# Patient Record
Sex: Female | Born: 1970 | Race: White | Hispanic: Yes | Marital: Single
Health system: Midwestern US, Community
[De-identification: ages and names within clinical notes are randomized; demographics above are authoritative.]

## PROBLEM LIST (undated history)

## (undated) DIAGNOSIS — I10 Essential (primary) hypertension: Secondary | ICD-10-CM

## (undated) DIAGNOSIS — M1990 Unspecified osteoarthritis, unspecified site: Secondary | ICD-10-CM

## (undated) DIAGNOSIS — C801 Malignant (primary) neoplasm, unspecified: Secondary | ICD-10-CM

## (undated) HISTORY — PX: MASTECTOMY: SHX3

## (undated) HISTORY — PX: NASAL RECONSTRUCTION WITH SEPTAL REPAIR: SHX5665

## (undated) HISTORY — PX: ABDOMINAL HYSTERECTOMY: SHX81

## (undated) HISTORY — PX: CARDIAC ELECTROPHYSIOLOGY STUDY AND ABLATION: SHX1294

## (undated) HISTORY — PX: ANTERIOR CRUCIATE LIGAMENT REPAIR: SHX115

---

## 2004-12-18 ENCOUNTER — Emergency Department (HOSPITAL_COMMUNITY): Admission: EM | Admit: 2004-12-18 | Discharge: 2004-12-18 | Payer: Self-pay | Admitting: Emergency Medicine

## 2006-09-04 ENCOUNTER — Emergency Department (HOSPITAL_COMMUNITY): Admission: EM | Admit: 2006-09-04 | Discharge: 2006-09-04 | Payer: Self-pay | Admitting: Emergency Medicine

## 2008-11-10 LAB — CBC WITH AUTOMATED DIFF
ABS. EOSINOPHILS: 0.1 10*3/uL (ref 0.0–0.4)
ABS. LYMPHOCYTES: 1.2 10*3/uL (ref 0.8–3.5)
ABS. MONOCYTES: 0.4 10*3/uL (ref 0–1.0)
ABS. NEUTROPHILS: 3.3 10*3/uL (ref 1.8–8.0)
BASOPHILS: 0 % (ref 0–3)
EOSINOPHILS: 3 % (ref 0–5)
HCT: 40.7 % (ref 36.0–46.0)
HGB: 13.5 g/dL (ref 12.0–16.0)
LYMPHOCYTES: 23 % (ref 20–51)
MCH: 28.3 PG (ref 25.0–35.0)
MCHC: 33.2 g/dL (ref 31.0–37.0)
MCV: 85.3 FL (ref 78.0–102.0)
MONOCYTES: 7 % (ref 2–9)
MPV: 7.1 FL — ABNORMAL LOW (ref 7.4–10.4)
NEUTROPHILS: 67 % (ref 42–75)
PLATELET: 279 10*3/uL (ref 130–400)
RBC: 4.78 M/uL (ref 4.10–5.10)
RDW: 14.2 % (ref 11.5–14.5)
WBC: 5 10*3/uL (ref 4.5–13.0)

## 2008-11-10 LAB — MAGNESIUM: Magnesium: 2.2 MG/DL (ref 1.8–2.4)

## 2008-11-10 LAB — METABOLIC PANEL, BASIC
Anion gap: 5 mmol/L (ref 5–15)
BUN/Creatinine ratio: 20 (ref 12–20)
BUN: 14 MG/DL (ref 7–18)
CO2: 27 MMOL/L (ref 21–32)
Calcium: 9.2 MG/DL (ref 8.4–10.4)
Chloride: 107 MMOL/L (ref 100–108)
Creatinine: 0.7 MG/DL (ref 0.6–1.3)
GFR est AA: >60 mL/min/{1.73_m2} (ref >60)
GFR est non-AA: >60 mL/min/{1.73_m2} (ref >60)
Glucose: 71 MG/DL — ABNORMAL LOW (ref 74–99)
Potassium: 4.5 MMOL/L (ref 3.5–5.5)
Sodium: 139 MMOL/L (ref 136–145)

## 2010-01-18 LAB — D-DIMER, QUANTITATIVE: D-Dimer, Quant: 0.35 ug/ml(FEU) (ref <0.46)

## 2010-01-18 LAB — DRUG SCREEN UR - NO CONFIRM
ACETAMINOPHEN: NEGATIVE
AMPHETAMINES: NEGATIVE
BARBITURATES: NEGATIVE
BENZODIAZEPINES: NEGATIVE
COCAINE: NEGATIVE
METHADONE: NEGATIVE
Methamphetamines: NEGATIVE
OPIATES: NEGATIVE
PCP(PHENCYCLIDINE): NEGATIVE
THC (TH-CANNABINOL): NEGATIVE
TRICYCLICS: NEGATIVE

## 2010-01-18 LAB — URINALYSIS W/ RFLX MICROSCOPIC
Bilirubin: NEGATIVE
Blood: NEGATIVE
Glucose: NEGATIVE MG/DL
Ketone: NEGATIVE MG/DL
Leukocyte Esterase: NEGATIVE
Nitrites: NEGATIVE
Protein: NEGATIVE MG/DL
Specific gravity: <1.005 (ref 1.003–1.030)
Urobilinogen: 0.2 EU/DL (ref 0.2–1.0)
pH (UA): 5.5 (ref 5.0–8.0)

## 2010-01-18 LAB — CBC WITH AUTOMATED DIFF
ABS. BASOPHILS: 0 10*3/uL (ref 0.0–0.1)
ABS. EOSINOPHILS: 0.1 10*3/uL (ref 0.0–0.4)
ABS. LYMPHOCYTES: 1.6 10*3/uL (ref 0.9–3.6)
ABS. MONOCYTES: 0.5 10*3/uL (ref 0.05–1.2)
ABS. NEUTROPHILS: 4.5 10*3/uL (ref 1.8–8.0)
BASOPHILS: 0 % (ref 0–2)
EOSINOPHILS: 2 % (ref 0–5)
HCT: 38.6 % (ref 35.0–45.0)
HGB: 13.5 g/dL (ref 12.0–16.0)
LYMPHOCYTES: 24 % (ref 21–52)
MCH: 28.7 PG (ref 24.0–34.0)
MCHC: 35 g/dL (ref 31.0–37.0)
MCV: 82.1 FL (ref 74.0–97.0)
MONOCYTES: 7 % (ref 3–10)
MPV: 9.3 FL (ref 9.2–11.8)
NEUTROPHILS: 67 % (ref 40–73)
PLATELET: 280 10*3/uL (ref 135–420)
RBC: 4.7 M/uL (ref 4.20–5.30)
RDW: 13.2 % (ref 11.6–14.5)
WBC: 6.7 10*3/uL (ref 4.6–13.2)

## 2010-01-18 LAB — TROPONIN I
Troponin-I, QT: <0.04 NG/ML (ref 0.00–0.08)
Troponin-I, QT: <0.04 NG/ML (ref 0.00–0.08)

## 2010-01-18 LAB — METABOLIC PANEL, COMPREHENSIVE
A-G Ratio: 1.2 (ref 0.8–1.7)
ALT (SGPT): 27 U/L — ABNORMAL LOW (ref 30–65)
AST (SGOT): 13 U/L — ABNORMAL LOW (ref 15–37)
Albumin: 4.4 g/dL (ref 3.4–5.0)
Alk. phosphatase: 105 U/L (ref 50–136)
Anion gap: 8 mmol/L (ref 5–15)
BUN/Creatinine ratio: 14 (ref 12–20)
BUN: 11 MG/DL (ref 7–18)
Bilirubin, total: 0.4 MG/DL (ref 0.1–0.9)
CO2: 26 MMOL/L (ref 21–32)
Calcium: 9.4 MG/DL (ref 8.4–10.4)
Chloride: 102 MMOL/L (ref 100–108)
Creatinine: 0.8 MG/DL (ref 0.6–1.3)
GFR est AA: >60 mL/min/{1.73_m2} (ref >60)
GFR est non-AA: >60 mL/min/{1.73_m2} (ref >60)
Globulin: 3.8 g/dL (ref 2.0–4.0)
Glucose: 90 MG/DL (ref 74–99)
Potassium: 3.7 MMOL/L (ref 3.5–5.5)
Protein, total: 8.2 g/dL (ref 6.4–8.2)
Sodium: 136 MMOL/L (ref 136–145)

## 2010-01-18 LAB — PROTHROMBIN TIME + INR
INR: 1 (ref 0.0–1.2)
Prothrombin time: 12.9 SECS (ref 11.5–15.2)

## 2010-01-18 LAB — CKMB PROFILE
CK - MB: 0.6 ng/ml (ref 0.5–3.6)
CK - MB: 0.5 ng/ml (ref 0.5–3.6)
CK-MB Index: 0.7 % (ref 0.0–4.0)
CK-MB Index: 0.6 % (ref 0.0–4.0)
CK: 83 U/L (ref 26–192)
CK: 80 U/L (ref 26–192)

## 2010-01-18 LAB — PTT: aPTT: 30.9 s (ref 24.6–37.7)

## 2010-01-18 LAB — D DIMER: D DIMER: 0.35 ug/ml(FEU) (ref <0.46)

## 2010-01-25 NOTE — Progress Notes (Signed)
Chief Complaint   Patient presents with   ??? Hospital Follow Up     heart palpitations, needs appt with Dr. Doristine Johns

## 2010-01-25 NOTE — Progress Notes (Signed)
HISTORY OF PRESENT ILLNESS  Carol Young is a 39 y.o. female.  HPI  1.  Rapid heart beat 2- 3 times a year.   So fast and pounding that makes her pass out.    One episode in 2009 and fell down the stairs and broke her leg.  Had 3 more episode of fall due to arrhythmias.     2.  Also increased urination and the back pain.( flank pain) only after each episode of palpitation and lasted about 2 hours and had dry mouth.    3.  Patient mentioned that she had cysts on the ovaries and was diagnosed with possible PCOS.    4. Acne on the chest    Patient Active Problem List   Diagnoses Date Noted   ??? Acne [706.1AQ] 01/25/2010   ??? Seborrheic eczema [690.18Q] 01/25/2010   ??? Cyst of ovary [620.2AZ] 01/25/2010   ??? Palpitation [785.1A] 01/25/2010       Review of Systems   Constitutional: Positive for malaise/fatigue. Negative for fever, chills, weight loss and diaphoresis.        Working long hours   HENT: Negative for hearing loss, ear pain, nosebleeds, congestion, neck pain, tinnitus and ear discharge.    Eyes: Negative for blurred vision, double vision, photophobia and pain.   Respiratory: Negative for cough, hemoptysis and shortness of breath.    Cardiovascular: Positive for chest pain, palpitations and leg swelling. Negative for orthopnea, claudication and PND.        Pounding pain   Gastrointestinal: Negative for heartburn, nausea, vomiting, abdominal pain, diarrhea and constipation.   Genitourinary: Positive for frequency and flank pain.        Only after the episode of palpitation and lasted a couple of days and then gone.  More like spasm.   Musculoskeletal: Negative for myalgias, back pain and joint pain.   Skin: Positive for rash and itching.        Was told adult onset acne    Scalp seborrhatic   Neurological: Positive for dizziness, tingling and headaches. Negative for weakness.        During the palpitation episode, just before it in the hands and feet.    Endo/Heme/Allergies: Positive for environmental allergies. Negative for polydipsia.   Psychiatric/Behavioral: Negative for depression. The patient has insomnia. The patient is not nervous/anxious.      Physical Exam   Constitutional: She is oriented to person, place, and time. She appears well-developed and well-nourished.   HENT:   Head: Normocephalic.   Eyes: Pupils are equal, round, and reactive to light. Right eye exhibits no discharge.   Cardiovascular: Normal rate, regular rhythm and normal heart sounds.  Exam reveals no gallop and no friction rub.    No murmur heard.  Pulmonary/Chest: Effort normal and breath sounds normal. No respiratory distress. She has no wheezes. She has no rales.   Abdominal: Soft. Bowel sounds are normal. She exhibits no distension. No tenderness. She has no rebound.        Flank test negative   Neurological: She is alert and oriented to person, place, and time.   Skin: Skin is warm and dry.   Psychiatric: She has a normal mood and affect. Her behavior is normal. Judgment and thought content normal.     BP 135/80   Pulse 75   Temp 98.3 ??F (36.8 ??C)   Resp 16   Ht 6' (1.829 m)   Wt 218 lb (98.884 kg)   BMI 29.57 kg/m2  LMP 01/25/2010    ASSESSMENT and PLAN  1. Heart palpitations (785.1K)  REFERRAL TO CARDIOLOGY   2. Acne (706.1AQ)   currently not on any medication   3. Seborrheic eczema (690.18Q)      """"""""""   4. Cyst of ovary (620.2AZ)          Patient will follow with Dr. Sigmund Hazel in regards to her heart arrythmia.  For the rest of issues.  She will book for a physical in the next few weeks and will get blood work done.  I have discussed the diagnosis with the patient and the fact that the patient does not require medication as this time.  However if the symptoms not improve with conservative management as given in the order above, he/she needs to return to clinic.  Patient in agreement with the above plan.

## 2010-01-25 NOTE — Patient Instructions (Signed)
English   Spanish  Palpitations: After Your Visit  Your Care Instructions  Heart palpitations are the uncomfortable sensation that your heart is beating fast or irregularly. You might feel pounding or fluttering in your chest. It might feel like your heart is skipping a beat.  Although palpitations may be caused by a heart problem, they also occur because of stress, fatigue, or use of alcohol, caffeine, or nicotine. Many medicines, including diet pills, antihistamines, decongestants, and some herbal products, can cause heart palpitations. Nearly everyone has palpitations from time to time.  Depending on your symptoms, your doctor may need to do more tests to try to find the cause of your palpitations.  Follow-up care is a key part of your treatment and safety. Be sure to make and go to all appointments, and call your doctor if you are having problems. It???s also a good idea to know your test results and keep a list of the medicines you take.  How can you care for yourself at home?  ?? Avoid caffeine, nicotine, and excess alcohol.   ?? Do not take illegal drugs, such as methamphetamines and cocaine.   ?? Do not take weight loss or diet medicines unless you talk with your doctor first.   ?? Get plenty of sleep.   ?? Do not overeat.   ?? If you have palpitations again, take deep breaths and try to relax. This may slow a racing heart.   ?? If you start to feel lightheaded, lie down to avoid injuries that might result if you pass out and fall down.   ?? Keep a record of your palpitations and bring it to your next doctor's appointment. Write down:   ?? The date and time.   ?? Your pulse. (If your heart is beating fast, it may be hard to count your pulse.)   ?? What you were doing when the palpitations started.   ?? How long the palpitations lasted.   ?? Any other symptoms.  ?? If an activity causes palpitations, slow down or stop. Talk to your doctor before you do that activity again.    ?? Take your medicines exactly as prescribed. Call your doctor if you think you are having a problem with your medicine.  When should you call for help?  Call 911 anytime you think you may need emergency care. For example, call if:  ?? You passed out (lost consciousness).   ?? You have chest pain or pressure. This may occur with:   ?? Sweating.   ?? Shortness of breath.   ?? Nausea or vomiting.   ?? Pain that spreads from the chest to the neck, jaw, or one or both shoulders or arms.   ?? Dizziness or lightheadedness.   ?? A fast or irregular pulse.  After calling 911, chew 1 adult-strength aspirin. Wait for an ambulance. Do not try to drive yourself.   ?? You have signs of a stroke. These may include:   ?? Sudden numbness, paralysis, or weakness in your face, arm, or leg, especially on only one side of your body.   ?? New problems with walking or balance.   ?? Sudden vision changes.   ?? Drooling or slurred speech.   ?? New problems speaking or understanding simple statements, or feeling confused.   ?? A sudden, severe headache that is different from past headaches.  Call your doctor now or seek immediate medical care if:  ?? You have heart palpitations and:   ?? Are  dizzy or lightheaded, or you feel like you may faint.   ?? Have new or increased shortness of breath.  Watch closely for changes in your health, and be sure to contact your doctor if:  ?? You continue to have heart palpitations.     Where can you learn more?   Go to [email or web address]  Enter R508 in the search box to learn more about "Palpitations: After Your Visit."    ?? 1995-2010 [employer]. Care instructions adapted under license by [employer] (which disclaims liability or warranty for this information). This care instruction is for use with your licensed healthcare professional. If you have questions about a medical condition or this instruction, always ask your healthcare professional. Healthwise disclaims any warranty or liability for your use of this information.  Content Version: 8.8.72453; Last Revised: May 8, 2009English   Spanish  Polycystic Ovary Syndrome: After Your Visit  Your Care Instructions  Polycystic ovary syndrome (PCOS) occurs when a hormone change causes problems with ovulation. Ovulation is when a woman's ovary releases an egg. If you have PCOS, you may not have normal, regular menstrual cycles. Doctors do not fully understand what causes PCOS, but it seems to be linked to genetics, obesity, and a risk for diabetes. If you have PCOS, your sisters and daughters have a higher chance of developing PCOS.  You may have other symptoms, such as weight gain, acne, too much hair growth on the face or body, high blood pressure, and high blood sugar levels. Your ovaries may have cysts, or growths that are not cancer, on them.  Keep in mind that although you may not have regular periods, you can still get pregnant. Talk to your doctor about birth control if you do not want to be pregnant. Sometimes the hormone changes with PCOS can also make it hard for some women to get pregnant (infertility). If this is a concern, talk to your doctor about treatment for infertility.  Women who have PCOS can go for months or longer with no period. Your doctor may recommend medicines that can help restore regular cycles. This may prevent heavy periods and precancerous changes in your uterus.   Follow-up care is a key part of your treatment and safety. Be sure to make and go to all appointments, and call your doctor if you are having problems. It???s also a good idea to know your test results and keep a list of the medicines you take.  How can you care for yourself at home?  ?? Take your medicines exactly as prescribed. Call your doctor if you think you are having a problem with your medicine.   ?? Eat a healthy diet. Include fruits, vegetables, beans, and whole grains in your diet every day.   ?? If you are overweight, losing weight can relieve many of the symptoms of PCOS. Talk to your doctor about safe methods for losing weight.   ?? Get at least 30 minutes of exercise on most days of the week. Walking is a good choice. You also may want to do other activities, such as running, swimming, cycling, or playing tennis or team sports.   ?? Unwanted hair growth can be treated with bleaching, plucking, electrolysis, or laser therapy.   ?? Acne can be treated with over-the-counter medicines that have benzoyl peroxide or salicylic acid in them.  When should you call for help?  Call your doctor now or seek immediate medical care if:  ?? You have severe vaginal bleeding. You are passing  clots of blood and soaking through your usual pads or tampons every hour for 2 or more hours.  Watch closely for changes in your health, and be sure to contact your doctor if:  ?? You have more vaginal bleeding, or bleeding is more irregular.     Where can you learn more?   Go to [email or web address]  Enter K559 in the search box to learn more about "Polycystic Ovary Syndrome: After Your Visit."    ?? 1995-2010 [employer]. Care instructions adapted under license by [employer] (which disclaims liability or warranty for this information). This care instruction is for use with your licensed healthcare professional. If you have questions about a medical condition or this instruction, always ask your healthcare professional. Healthwise disclaims any warranty or liability for your use of this information.  Content Version: 8.8.72453; Last Revised: October 15, 2008

## 2010-01-28 NOTE — Progress Notes (Signed)
History of Present Illness:   A 39 year old Caucasian female referred by Dr. Dalene Carrow for recurrent syncope and palpitations.  Her symptoms started back in her 47s when she noted she would overexert herself during exercise.  She would sometimes feel a racing heart and she would actually cough and the palpitations would terminate.  Over the past few years, she has had multiple episodes of syncope associated with palpitations and a racing heart. Interestingly, when she feels her heart race, she has the urge to urinate.  She actually fell in 2009 during one episode and actually broke her leg.  She had an echocardiogram by report by Dr. Alfredo Martinez in [facility] that was normal.  She also had a stress test that she says was within normal limits but she feels like she did not exercise as hard as she could have.  She denies any chest pain, dyspnea, PND, orthopnea, edema.  She has not tried any medications.  She states that she had an event monitor that she wore for a number of weeks in the past but this was non-revealing.      Impression:   1. Recurrent syncope.  2. Recurrent palpitations with a racing heart along with urinary urgency concerning for SVT.  3. History of trauma with broken leg related to her syncope in the past.     Overall she has sense of doom when she has these episodes.  They have greatly affected her lifestyle and she is even afraid to buy a house with two stories in case she falls or has a syncopal event she will harm herself.  I would like to obtain the echocardiogram from Dr. Sandrea Hammond office.  However, I feel given her constellation of symptoms specifically the severity of the syncope that we should proceed with electrophysiology study with possible ablation.  I noted that if she had typical AVNRT or accessory pathway we would go ahead and ablate it at that time.  If she, however, had more complex arrhythmias such as atrial fibrillation or atrial flutter or ventricular arrhythmia, we would stop and talk further about treatment options.  Nonetheless, she would like to think about it and she will get back to [place].  I feel starting her on empiric at this time may not be in her best interests since we do not have a definitive diagnosis.  She also may benefit from an implantable loop recorder if she does not want to proceed with ablation at this time or if her electrophysiology is not diagnostic.      Past Medical History   Diagnosis Date   ??? Contact dermatitis and other eczema, due to unspecified cause        No current outpatient prescriptions on file.       Social History   reports that she has quit smoking. She has never used smokeless tobacco.   reports that she drinks alcohol.    Family History  family history includes Arrhythmia in her father; Cancer in her brother, maternal aunt, and maternal grandmother; and Hypertension in her father.    Review of Systems  Except as stated above include:  Constitutional: Negative for fever, chills and malaise/fatigue.   HEENT: Negative.   Gastrointestinal: Negative.  Genitourinary: Negative.   Musculoskeletal: Negative.  Neurological: Negative.   Endocrine:  Negative  Psychiatric:  Negative    PHYSICAL EXAM  BP Readings from Last 3 Encounters:   01/28/10 120/82   01/25/10 135/80        Pulse Readings  from Last 3 Encounters:   01/28/10 70   01/25/10 75       Wt Readings from Last 3 Encounters:   01/28/10 218 lb (98.884 kg)   01/25/10 218 lb (98.884 kg)       General:  Alert and oriented to person, place, and time.  No acute distress.  Head and Neck:  No jugular venous distention or carotid bruits.  Lungs:  Clear bilaterally.  Heart:  Regular rate and rhythm.  Normal S1/S2.  No significant murmurs, rubs or gallops.  Abdomen:  Soft and nontender.  Extremities:  No significant edema.  Neurological:  Grossly normal.

## 2010-02-01 MED ORDER — CLINDAMYCIN-BENZOYL PEROXIDE 1 %-5 % TOPICAL GEL
1-5 % | Freq: Two times a day (BID) | CUTANEOUS | Status: AC
Start: 2010-02-01 — End: ?

## 2010-02-01 NOTE — Progress Notes (Signed)
Subjective:   39 y.o. female for annual routine Pap and checkup.  Patient's last menstrual period was 01/25/2010.    Social History: single partner, contraception - condoms.  Pertinent past medical hstory: no history of HTN, DVT, CAD, DM, liver disease, migraines or smoking.    Patient Active Problem List   Diagnoses Code   ??? Acne 706.1AQ   ??? Seborrheic eczema 690.18Q   ??? Cyst of ovary 620.2AZ   ??? Palpitation 785.1A   ??? Syncope and collapse 780.2          ROS:  Feeling well. No dyspnea or chest pain on exertion.  No abdominal pain, change in bowel habits, black or bloody stools.  No urinary tract symptoms. GYN ROS: Heavy bleeding some times two times a month, pelvic pain or discharge, has history of cervicitis, no breast pain or new or enlarging lumps on self exam, had two breast lumpectomy . They were benign.  Also acne on the face and chest. No neurological complaints.    Objective:   BP 130/76   Pulse 78   Temp 98.4 ??F (36.9 ??C)   Resp 16   Ht 6' (1.829 m)   Wt 216 lb (97.977 kg)   BMI 29.29 kg/m2   LMP 01/25/2010  The patient appears well, alert, oriented x 3, in no distress.  ENT normal.  Neck supple. No adenopathy or thyromegaly. PERLA. Lungs are clear, good air entry, no wheezes, rhonchi or rales. S1 and S2 normal, no murmurs, regular rate and rhythm. Abdomen soft without tenderness, guarding, mass or organomegaly. Extremities show no edema, normal peripheral pulses. Neurological is normal, no focal findings.    BREAST EXAM: breasts appear normal, no suspicious masses, no skin or nipple changes or axillary nodes    PELVIC EXAM: normal external genitalia, vulva, vagina, cervix, uterus and adnexa,  Has folliculitis.    Assessment/Plan:   well woman  pap smear  return annually or prn  1. Acne vulgaris (706.1E)  clindamycin-benzoyl peroxide (DUAC) 1-5 % topical gel    2. Well woman exam (V70.0L)  CBC WITH AUTOMATED DIFF, METABOLIC PANEL, COMPREHENSIVE, LIPID PANEL, TSH, 3RD GENERATION, VITAMIN D, 25 HYDROXY, URINALYSIS W/O MICRO   3. Screening for malignant neoplasm of the cervix (V76.2)  PAP, LIQUID BASED, IMAGED W/HPV ZOXWR([id number])     Patient was seen by Dr. 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  She had some questions and we answered to the best of our ability.    We discussed the above diagnosis and order. Patient will be seen after the blood work results received.  I have advised the patient if she has any concern to come and see me.      Patient understands and agrees with the above plan.

## 2010-02-01 NOTE — Progress Notes (Deleted)
Chief Complaint   Patient presents with   ??? Follow-up       HISTORY OF PRESENT ILLNESS  Carol Young is a 39 y.o. female.  HPI    ROS    Physical Exam    ASSESSMENT and PLAN  {ASSESSMENT/PLAN:19072}

## 2010-02-02 LAB — CBC WITH AUTOMATED DIFF
ABS. BASOPHILS: 0.1 10*3/uL (ref 0.0–0.2)
ABS. EOSINOPHILS: 0.2 10*3/uL (ref 0.0–0.4)
ABS. IMM. GRANS.: 0 10*3/uL (ref 0.0–0.1)
ABS. LYMPHOCYTES: 1.2 10*3/uL (ref 0.7–4.5)
ABS. MONOCYTES: 0.5 10*3/uL (ref 0.1–1.0)
ABS. NEUTROPHILS: 3.3 10*3/uL (ref 1.8–7.8)
BASOPHILS: 1 % (ref 0–3)
EOSINOPHILS: 3 % (ref 0–7)
HCT: 42.9 % (ref 34.0–44.0)
HGB: 13.6 g/dL (ref 11.5–15.0)
IMMATURE GRANULOCYTES: 0 % (ref 0–2)
LYMPHOCYTES: 23 % (ref 14–46)
MCH: 27.9 pg (ref 27.0–34.0)
MCHC: 31.7 g/dL — ABNORMAL LOW (ref 32.0–36.0)
MCV: 88 fL (ref 80–98)
MONOCYTES: 9 % (ref 4–13)
NEUTROPHILS: 64 % (ref 40–74)
PLATELET: 303 10*3/uL (ref 140–415)
RBC: 4.88 x10E6/uL (ref 3.80–5.10)
RDW: 14 % (ref 11.7–15.0)
WBC: 5.2 10*3/uL (ref 4.0–10.5)

## 2010-02-02 LAB — METABOLIC PANEL, COMPREHENSIVE
A-G Ratio: 2 (ref 1.1–2.5)
ALT (SGPT): 12 IU/L (ref 0–40)
AST (SGOT): 10 IU/L (ref 0–40)
Albumin: 4.7 g/dL (ref 3.5–5.5)
Alk. phosphatase: 97 IU/L (ref 25–150)
BUN/Creatinine ratio: 9 (ref 8–20)
BUN: 8 mg/dL (ref 6–20)
Bilirubin, total: 0.4 mg/dL (ref 0.0–1.2)
CO2: 26 mmol/L (ref 20–32)
Calcium: 9.3 mg/dL (ref 8.7–10.2)
Chloride: 99 mmol/L (ref 97–108)
Creatinine: 0.85 mg/dL (ref 0.57–1.00)
GFR est AA: 101 mL/min/{1.73_m2} (ref >59)
GFR est non-AA: 87 mL/min/{1.73_m2} (ref >59)
GLOBULIN, TOTAL: 2.4 g/dL (ref 1.5–4.5)
Glucose: 85 mg/dL (ref 65–99)
Potassium: 3.8 mmol/L (ref 3.5–5.2)
Protein, total: 7.1 g/dL (ref 6.0–8.5)
Sodium: 136 mmol/L (ref 135–145)

## 2010-02-02 LAB — LIPID PANEL
Cholesterol, total: 179 mg/dL (ref 100–199)
HDL Cholesterol: 54 mg/dL (ref >39)
LDL, calculated: 106 mg/dL — ABNORMAL HIGH (ref 0–99)
Triglyceride: 95 mg/dL (ref 0–149)
VLDL, calculated: 19 mg/dL (ref 5–40)

## 2010-02-02 LAB — TSH 3RD GENERATION: TSH: 2.19 u[IU]/mL (ref 0.450–4.500)

## 2010-02-02 LAB — VITAMIN D, 25 HYDROXY: Vitamin D 25-Hydroxy: 24 ng/mL — ABNORMAL LOW (ref 32.0–100.0)

## 2010-02-02 NOTE — Progress Notes (Addendum)
Quick Note:    Please advise patient Lab work more normal only vitam D def. Could see her and explain and start her on medication. Thank you  ______

## 2010-02-03 LAB — WRITTEN AUTHORIZATION

## 2010-02-03 LAB — URINALYSIS (NO MICRO)
Bilirubin: NEGATIVE
Blood: NEGATIVE
Glucose: NEGATIVE
Ketone: NEGATIVE
Leukocyte Esterase: NEGATIVE
Nitrites: NEGATIVE
Protein: NEGATIVE
Specific Gravity: 1.026 (ref 1.005–1.030)
Urobilinogen: 0.2 mg/dL (ref 0.0–1.9)
pH (UA): 6.5 (ref 5.0–7.5)

## 2010-02-03 NOTE — Telephone Encounter (Signed)
Message copied by Tobin Chad on Wed Feb 03, 2010 12:40 PM  ------       Message from: Velia Meyer       Created: Tue Feb 02, 2010  7:22 PM         Please advise patient  Lab work more normal only vitam D def.  Could see her and explain and start her on medication.  Thank you

## 2010-02-03 NOTE — Telephone Encounter (Signed)
Patient aware of lab results

## 2010-02-05 NOTE — Progress Notes (Signed)
Quick Note:    Pap normal please advise.  ______

## 2010-02-09 NOTE — Progress Notes (Signed)
Here for f/u on Vitamin d.

## 2010-02-09 NOTE — Patient Instructions (Signed)
English   Spanish  Learning About Vitamin D  Why is it important to get enough vitamin D?  Your body needs vitamin D to absorb calcium. Calcium keeps your bones and muscles, including your heart, healthy and strong. Your body uses vitamin D to help your muscles absorb calcium and work well. If your muscles don't get enough calcium, then they can cramp, hurt, or feel weak. You may have long-term (chronic) muscle aches and pains.  People who do not get enough vitamin D throughout life have an increased chance of having thin and brittle bones (osteoporosis) in their later years. Children who don't get enough vitamin D may not grow as much as others their age. They also have a chance of getting a rare disease called rickets, which causes weak bones.  Research suggests that low levels of vitamin D also may be linked to a number of other problems such as high blood pressure, cancer, and heart disease.  Your body uses sunshine to make vitamin D. Another way to get vitamin D is from the foods you eat. Vitamin D also is available in supplements.  What is the recommended daily amount of vitamin D?  The amount of vitamin D you need changes as you get older. Most doctors suggest the following:  ?? Children and teens need 400 IU (international units) every day.   ?? People ages 19 to 49 need 400 to 800 IU every day.   ?? People ages 50 and older need 800 to 1,000 IU every day.  What is vitamin D deficiency?  Vitamin D deficiency means that the level of vitamin D in your body is lower than it should be. Most people don't get enough vitamin D.  In the winter, people often spend more time indoors and don't get enough sun. This can reduce how much vitamin D your body makes.  How can you get more vitamin D?  You can get vitamin D by:  ?? Taking a vitamin D pill, vitamin D drops, or a multivitamin pill. Many calcium pills also contain vitamin D.    ?? Eating foods that have vitamin D such as egg yolks, liver, oily fish, and foods and drinks with added (fortified) vitamin D. Most people don't get enough vitamin D through diet alone.   ?? Being out in sunlight a few days a week for 10 to 15 minutes without sunscreen. But experts disagree about whether people should spend even 10 to 15 minutes a day in the sun without sunscreen, because sunscreen helps prevent skin cancer. So you may want to get vitamin D from eating a healthy diet that includes foods fortified with vitamin D and by taking vitamin D pills.  Are there any risks from taking vitamin D?  ?? Getting too much vitamin D can cause problems, just like getting too little can. Talk with your doctor about how much and what sources of vitamin D are right for you and your child.   ?? Too much of any vitamin can make a child sick. Follow your doctor's instructions about using vitamin drops so that you don't give your child too much.   ?? Vitamin D may interact with other medicines. Tell your doctor about all of the medicines you take, including over-the-counter drugs, herbs, and pills. Tell your doctor about all of your current medical problems.     Where can you learn more?   Go to http://www.healthwise.net/[facility]  Enter V530 in the search box to learn more about "Learning   About Vitamin D."   ?? 1995-2010 Healthwise, Incorporated. Care instructions adapted under license by [employer] (which disclaims liability or warranty for this information). This care instruction is for use with your licensed healthcare professional. If you have questions about a medical condition or this instruction, always ask your healthcare professional. Healthwise disclaims any warranty or liability for your use of this information.  Content Version: 8.8.72453; Last Revised: October 13, 2010English   Spanish  Learning About Calcium  What is calcium?   Calcium is a mineral the body needs to make bones and teeth, transmit nerve messages, tighten (contract) muscles, and help the blood to clot and the heart to work properly.  Everyone needs calcium. How much you need changes as you age. After age 30, men and women naturally begin to lose bone mass. You can slow bone loss and possibly prevent osteoporosis by eating a diet rich in calcium and vitamin D. Getting enough calcium and vitamin D is especially important for women in the first few years after menopause.  Calcium is found in milk and milk products (including yogurt and cheese), in some leafy green vegetables (broccoli, spinach, kale), in beans, and in some nuts.  Most people do not get enough calcium through diet alone. You need to eat 3 to 4 servings a day of foods high in calcium to get the recommended daily amount.  If you do not get enough calcium from the foods you eat, change your diet or take calcium and vitamin D supplements. Your body needs vitamin D to absorb calcium.  How much calcium do you need?  How much calcium you need each day changes as you age.  ?? Age 1 to 3 years: 500 milligrams   ?? Age 4 to 8 years: 800 milligrams   ?? Age 9 to 18 years: 1,300 milligrams   ?? Age 19 to 50 years: 1,000 milligrams   ?? Older than 50 years: 1,200 milligrams  Pregnant or nursing women need the same amount of calcium as other women their age: 1,000 to 1,300 milligrams a day.  How can you get enough calcium?  The best source of calcium is milk fortified with vitamin D. Four glasses of milk a day provide about 1,200 milligrams of calcium.  Common foods with calcium:  ?? Yogurt (plain or low-fat). An 8-ounce serving provides 415 milligrams of calcium.   ?? Cheddar cheese. A 1 1/2-ounce serving provides 306 milligrams.   ?? Milk (skim, 2%, or whole). A 1-cup serving provides about 300 milligrams.   ?? Cottage cheese (1% milk fat). A 1-cup serving provides 138 milligrams.   If you cannot eat or drink dairy foods, you can try:  ?? Foods that are calcium-fortified, such as:   ?? Orange juice. A 1-cup serving of calcium-fortified orange juice provides 500 milligrams of calcium.   ?? Soy milk. A 1-cup serving of enriched soy milk provides 300 milligrams of calcium.  ?? Almonds. A 1/4-cup serving provides 95 milligrams of calcium.   ?? Canned salmon. A 3-ounce serving provides 180 milligrams of calcium.   ?? Tofu (firm, made with calcium sulfate). A 1/2-cup serving provides 204 milligrams.  You may also need to take a supplement to make sure you are getting the calcium you need. It is important to take calcium supplements with vitamin D, which helps your body absorb the calcium. Examples of supplements include calcium carbonate and calcium citrate.  Everyone who has been diagnosed with osteoporosis should take calcium and vitamin D   supplements in addition to eating a diet rich in these nutrients.     Where can you learn more?   Go to http://www.healthwise.net/[facility]  Enter S264 in the search box to learn more about "Learning About Calcium."   ?? 1995-2010 Healthwise, Incorporated. Care instructions adapted under license by [employer] (which disclaims liability or warranty for this information). This care instruction is for use with your licensed healthcare professional. If you have questions about a medical condition or this instruction, always ask your healthcare professional. Healthwise disclaims any warranty or liability for your use of this information.  Content Version: 8.8.72453; Last Revised: November 07, 2008

## 2010-02-09 NOTE — Progress Notes (Signed)
S/  Carol Young is a 39 y.o. female who is here to review the result of  Blood work.     Patient  Is still  having symptoms of palpitation and one spell that she almost passed out.    Past medical history:I have reviewed and confirmed the past medical history in the chart.    Allergies: reviewed allergy section in the chart    Patient Active Problem List   Diagnoses Code   ??? Acne 706.1AQ   ??? Seborrheic eczema 690.18Q   ??? Cyst of ovary 620.2AZ   ??? Palpitation 785.1A   ??? Syncope and collapse 780.2       Current outpatient prescriptions   Medication Sig Dispense Refill   ??? clindamycin-benzoyl peroxide (DUAC) 1-5 % topical gel Apply  to affected area two (2) times a day. Apply to affected area after the skin has been cleansed and dried.  1 Tube  0         O/ NAD  BP 131/77   Pulse 70   Temp(Src) 98.3 ??F (36.8 ??C) (Oral)   Resp 16   Ht 6' (1.829 m)   Wt 220 lb (99.791 kg)   BMI 29.84 kg/m2   LMP 01/25/2010    Examination not done except vitals      Component Value Date/Time   WBC 5.2 02/01/10 10:00 AM   HGB 13.6 02/01/10 10:00 AM   HCT 42.9 02/01/10 10:00 AM   PLATELET 303 02/01/10 10:00 AM   MCV 88 02/01/10 10:00 AM         Component Value Date/Time   Cholesterol, total 179 02/01/10 10:00 AM   HDL Cholesterol 54 02/01/10 10:00 AM   LDL, calculated 106 02/01/10 10:00 AM   Triglyceride 95 02/01/10 10:00 AM         Component Value Date/Time   GFR est AA 101 02/01/10 10:00 AM   GFR est non-AA 87 02/01/10 10:00 AM   Creatinine 0.85 02/01/10 10:00 AM   BUN 8 02/01/10 10:00 AM   Sodium 136 02/01/10 10:00 AM   Potassium 3.8 02/01/10 10:00 AM   Chloride 99 02/01/10 10:00 AM   CO2 26 02/01/10 10:00 AM          Component Value Date/Time   TSH, 3rd generation 2.190 02/01/10 10:00 AM        Lab Results   Component Value Date/Time    Vitamin D,25-Hydroxy 24.0 02/01/10 10:00 AM           A & P /    1. Vitamin D deficiency (268.9G)  Cholecalciferol, Vitamin D3, (VITAMIN D) 2,000 unit Cap   2. Arrhythmia (427.9D)          Patient has thought about the procedure for her heart and is calling the cardiologist today to set the time.    I have discussed the diagnosis with the patient and the intended plan as seen in the above orders.  I have discussed the changes that need to happen based on the above results.     New medication side effects and warnings have been discussed with the patient as well.    I also advised patient if the symptoms gets worse or any concerns to return to the clinic, otherwise will see her for follow up  In 3  Months    Over 50% of the 25 minutes face to face with Carol Young consisted of counseling and/or discussing treatment plans in reference to her The primary encounter diagnosis  was Vitamin D deficiency. A diagnosis of Arrhythmia was also pertinent to this visit.    Patient in agreement with the above plan.

## 2010-02-10 LAB — METABOLIC PANEL, COMPREHENSIVE
A-G Ratio: 1.3 (ref 0.8–1.7)
ALT (SGPT): 28 U/L — ABNORMAL LOW (ref 30–65)
AST (SGOT): 11 U/L — ABNORMAL LOW (ref 15–37)
Albumin: 3.8 g/dL (ref 3.4–5.0)
Alk. phosphatase: 96 U/L (ref 50–136)
Anion gap: 9 mmol/L (ref 5–15)
BUN/Creatinine ratio: 17 (ref 12–20)
BUN: 12 MG/DL (ref 7–18)
Bilirubin, total: 0.2 MG/DL (ref 0.1–0.9)
CO2: 26 MMOL/L (ref 21–32)
Calcium: 9.2 MG/DL (ref 8.4–10.4)
Chloride: 103 MMOL/L (ref 100–108)
Creatinine: 0.7 MG/DL (ref 0.6–1.3)
GFR est AA: >60 mL/min/{1.73_m2} (ref >60)
GFR est non-AA: >60 mL/min/{1.73_m2} (ref >60)
Globulin: 3 g/dL (ref 2.0–4.0)
Glucose: 108 MG/DL — ABNORMAL HIGH (ref 74–99)
Potassium: 4.1 MMOL/L (ref 3.5–5.5)
Protein, total: 6.8 g/dL (ref 6.4–8.2)
Sodium: 138 MMOL/L (ref 136–145)

## 2010-02-10 LAB — CBC WITH AUTOMATED DIFF
ABS. BASOPHILS: 0 10*3/uL (ref 0.0–0.06)
ABS. EOSINOPHILS: 0.1 10*3/uL (ref 0.0–0.4)
ABS. LYMPHOCYTES: 1.1 10*3/uL (ref 0.9–3.6)
ABS. MONOCYTES: 0.3 10*3/uL (ref 0.05–1.2)
ABS. NEUTROPHILS: 3.6 10*3/uL (ref 1.8–8.0)
BASOPHILS: 1 % (ref 0–2)
EOSINOPHILS: 2 % (ref 0–5)
HCT: 38.6 % (ref 35.0–45.0)
HGB: 12.8 g/dL (ref 12.0–16.0)
LYMPHOCYTES: 22 % (ref 21–52)
MCH: 27.9 PG (ref 24.0–34.0)
MCHC: 33.2 g/dL (ref 31.0–37.0)
MCV: 84.3 FL (ref 74.0–97.0)
MONOCYTES: 5 % (ref 3–10)
MPV: 10 FL (ref 9.2–11.8)
NEUTROPHILS: 70 % (ref 40–73)
PLATELET: 254 10*3/uL (ref 135–420)
RBC: 4.58 M/uL (ref 4.20–5.30)
RDW: 13.4 % (ref 11.6–14.5)
WBC: 5.2 10*3/uL (ref 4.6–13.2)

## 2010-02-10 LAB — PROTHROMBIN TIME + INR
INR: 0.9 (ref 0.0–1.2)
Prothrombin time: 12.3 SECS (ref 11.5–15.2)

## 2010-02-10 NOTE — Progress Notes (Signed)
Quick Note:    Ok for procedure  Betsabe Iglesia J Solita Macadam, RN    ______

## 2010-02-10 NOTE — Progress Notes (Signed)
Quick Note:    Pre-procedure labs..Ceil Roderick, CMA    ______

## 2010-02-10 NOTE — Progress Notes (Signed)
Patient instructions given

## 2010-02-15 NOTE — Procedures (Signed)
[facility]   [facility]   [address]   [place], [place] [address]     PACEMAKER    PATIENT: Carol Young, Carol Young  MRN: [id number] DATE: 02/15/2010  BILLING: [id number] ROOM: [id number]  REFERRING:  DICTATING: Delena Bali, MD      PROCEDURE  1. Cardiac electrophysiology study.  2. Placement of coronary sinus, HIS, right atrial and right ventricular  catheters.  3. Pacing and sensing, with arrhythmia induction attempt before and after  ablation.  4. Infusion of isoproterenol.  5. Slow pathway typical AVNRT radiofrequency ablation.  6. Conscious sedation.  7. Pacing and sensing from the right atrium, right ventricle and left  atrium via the coronary sinus.    PRE-PROCEDURE DIAGNOSES  1. Recurrent palpitations along with supraventricular tachycardia.  2. History of syncope.    POSTPROCEDURE DIAGNOSES: Easily inducible typical AVNRT status, post  ablation of the slow pathway, without inducible tachycardia post  procedure.    PROCEDURE: After informed consent was obtained, the patient was brought to  the electrophysiology lab in a fasting and nonsedated state. The right  groin was prepped and draped in usual sterile fashion. Local lidocaine was  infiltrated just above the right femoral vein. In the right femoral vein  was inserted a 6, 7, 8-French sheath. The 8-French sheath was later upsized  to a RAMP sheath for ablation and manipulation of the Blazer II XP ablation  catheter. Another initial rhythm was sinus with right-to-left activation PR  interval 209 msec, QRS interval 90 msec, RR interval 800 msec, QT interval  277 msec, H interval 75 msec. HV interval 45 msec. Decapolar deflectable  catheter was placed in the coronary sinus. A CRD-2 catheter was placed  along the His. A Josephson was placed along the right atrium and right  ventricle and switched for pacing maneuvers. Initial pacing from the right  ventricle showed no evidence of a lateral retrograde accessory pathway,   either on the right or left. Pacing from the right atrium revealed a jump  along with easily inducible short RP tachycardia. Initially, the [place]  interval was approximately 120 msec. This was initially induced with simply  catheter manipulation. The patient was given some fentanyl due to  significant anxiety and pain. After this, it was difficult to induce the  arrhythmia and Isuprel infusion was given, up to 2 mcg a minute. Easily  inducible short VA tachycardia was induced with a cycle length of  approximately 300 msec. His refractory PVCs showed no evidence of  activation. Entrainment revealed a VAV response. 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 A Blazer II XP catheter 4 mm tip was  therefore used, positioned through a RAMP sheath near the slow pathway.  Multiple ablations were created with junctional without block along this  area. Post procedure, isoproterenol was again infused and there was no  inducible arrhythmia with attempted pacing induction from the right atrium  and right ventricle. There was no evidence of echo beats and no evidence of  jump.    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 After slow pathway ablation, there  was no inducible arrhythmia along with no evidence of echo beats or jump  when pacing from the atrium. If she requires further ablation, I would  bring her back and plan to use 3D mapping to further evaluate any  arrhythmias or to further ablate along the slow pathway.  Electronically Signed   Delena Bali, MD 02/21/2010 10:12   Delena Bali, MD    RS:WMX  D: 02/15/2010  CScript: 02/15/2010 11:40 P  CQDocID #: [id number] CScriptDoc #: [id number]  cc: Delena Bali, MD

## 2010-02-15 NOTE — Procedures (Signed)
Procedures signed by  at 02/21/10  1012                 Author: Arletha Pili, MD  Service: --  Author Type: Physician       Filed: 02/21/10 1013  Date of Service: 02/15/10 2340  Status: Signed          Editor: Arletha Pili, MD (Physician)            Procedure Orders        1. TEMP PACER INSERT SINGLE [[id number]] ordered by  at 02/15/10 2340                         <!--EPICS-->                         [facility]<BR>                           [facility]<BR>                               [address]<BR>                         [place], [place] [address]<BR> <BR>                                  PACEMAKER<BR> <BR> PATIENT:   Carol Young, Carol Young MRN:           [id number]    DATE:   02/15/2010<BR> BILLING:       [id number]    ROOM:   [id number]<BR> REFERRING:<BR> DICTATING: Domnique Vantine, MD<BR> <BR> <BR> PROCEDURE<BR> 1. Cardiac electrophysiology study.<BR> 2. Placement of coronary sinus, HIS, right atrial and right ventricular<BR> catheters.<BR> 3. Pacing and sensing, with  arrhythmia induction attempt before and after<BR> ablation.<BR> 4. Infusion of isoproterenol.<BR> 5. Slow pathway typical AVNRT radiofrequency ablation.<BR> 6. Conscious sedation.<BR> 7. Pacing and sensing from the right atrium, right ventricle and left<BR>  atrium via the coronary sinus.<BR> <BR> PRE-PROCEDURE DIAGNOSES<BR> 1. Recurrent palpitations along with supraventricular tachycardia.<BR> 2. History of syncope.<BR> <BR> POSTPROCEDURE DIAGNOSES:  Easily inducible typical AVNRT status, post<BR> ablation  of the slow pathway, without inducible tachycardia post<BR> procedure.<BR> <BR> PROCEDURE:  After informed consent was obtained, the patient was brought to<BR> the electrophysiology lab in a fasting and nonsedated state. The right<BR> groin was prepped  and draped in usual sterile fashion. Local lidocaine was<BR> infiltrated just above the right femoral vein. In the right femoral vein<BR> was  inserted a 6, 7, 8-French sheath. The 8-French sheath was later upsized<BR> to a RAMP sheath for ablation and  manipulation of the Blazer II XP ablation<BR> catheter. Another initial rhythm was sinus with right-to-left activation PR<BR> interval 209 msec, QRS interval 90 msec, RR interval 800 msec, QT interval<BR> 277 msec, H interval 75 msec. HV interval 45 msec.  Decapolar deflectable<BR> catheter was placed in the coronary sinus. A CRD-2 catheter was placed<BR> along the His. A Josephson was placed along the right atrium and right<BR> ventricle and switched for pacing maneuvers. Initial pacing from the right<BR>  ventricle showed no evidence of a lateral retrograde accessory pathway,<BR> either on the right or left. Pacing from the right atrium revealed a jump<BR> along with easily  inducible short RP tachycardia. Initially, the VA<BR> interval was approximately  120 msec. This was initially induced with simply<BR> catheter manipulation. The patient was given some fentanyl due to<BR> significant anxiety and pain. After this, it was difficult to induce the<BR> arrhythmia and Isuprel infusion was given, up to 2  mcg a minute. Easily<BR> inducible short VA tachycardia was induced with a cycle length of<BR> approximately 300 msec. His refractory PVCs showed no evidence of<BR> activation. Entrainment revealed a VAV response. Findings are most<BR> consistent with  typical AVNRT. A Blazer II XP catheter 4 mm tip was<BR> therefore used, positioned through a RAMP sheath near the slow pathway.<BR> Multiple ablations were created with junctional without block along this<BR> area. Post procedure, isoproterenol was again  infused and there was no<BR> inducible arrhythmia with attempted pacing induction from the right atrium<BR> and right ventricle. There was no evidence of echo beats and no evidence of<BR> jump.<BR> <BR> IMPRESSION:  Easily inducible typical atrioventricular  non-reentry<BR> tachycardia by electrophysiology  study. After slow pathway ablation, there<BR> was no inducible arrhythmia along with no evidence of echo beats or jump<BR> when pacing from the atrium. If she requires further ablation, I would<BR> bring  her back and plan to use 3D mapping to further evaluate any<BR> arrhythmias or to further ablate along the slow pathway.<BR> <BR> <BR> <BR> <BR> <BR> <BR> <BR> <BR> <BR>       Electronically Signed<BR>       Delena Bali, MD 02/21/2010 10:12<BR>                                         Madeline Pho, MD<BR> <BR> RS:WMX<BR> D: 02/15/2010<BR> CScript: 02/15/2010 11:40 P<BR> CQDocID #: [id number]  CScriptDoc #: [id number]<BR> cc:   Delena Bali, MD<BR> <!--EPICE-->

## 2010-03-11 NOTE — Progress Notes (Signed)
History of present illness:  This is a 39 year old Caucasian female here for follow up after recent ablation.  She had a history of syncope, recurrent palpitations and SVT.  She underwent electrophysiology study February 21, 2010 without complications.  She had easily inducible AVNRT and had small pathway ablation.  Post procedure, despite the use of Isoproterenol she had no inducible arrhythmias.  She denies any new chest pain, dyspnea, presyncope, syncope, PND, orthopnea or edema.    Impression:   1. 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  2. History of syncope, likely due to SVT.    Overall, she is doing well.  I will plan to see her back in one year.  She has a less than 5% recurrence of arrhythmia.      Past Medical History   Diagnosis Date   ??? Contact dermatitis and other eczema, due to unspecified cause          Current outpatient prescriptions   Medication Sig Dispense Refill   ??? Cholecalciferol, Vitamin D3, (VITAMIN D) 2,000 unit Cap Take 2,000 Units by mouth daily.  90 Tab  2   ??? clindamycin-benzoyl peroxide (DUAC) 1-5 % topical gel Apply  to affected area two (2) times a day. Apply to affected area after the skin has been cleansed and dried.  1 Tube  0         Social History   reports that she has quit smoking. She has never used smokeless tobacco.   reports that she drinks alcohol.    Family History  family history includes Arrhythmia in her father; Cancer in her brother, maternal aunt, and maternal grandmother; and Hypertension in her father.    Review of Systems  Except as stated above include:  Constitutional: Negative for fever, chills and malaise/fatigue.   HEENT: Negative.   Gastrointestinal: Negative.  Genitourinary: Negative.   Musculoskeletal: Negative.  Neurological: Negative.   Endocrine:  Negative  Psychiatric:  Negative    PHYSICAL EXAM  BP Readings from Last 3 Encounters:   03/11/10 124/76   02/09/10 131/77   02/01/10 130/76        Pulse Readings from Last 3 Encounters:   03/11/10 69   02/09/10 70   02/01/10 78       Wt Readings from Last 3 Encounters:   03/11/10 220 lb 6.4 oz (99.973 kg)   02/09/10 220 lb (99.791 kg)   02/01/10 216 lb (97.977 kg)       General:  Alert and oriented to person, place, and time.  No acute distress.  Head and Neck:  No jugular venous distention or carotid bruits.  Lungs:  Clear bilaterally.  Heart:  Regular rate and rhythm.  Normal S1/S2.  No significant murmurs, rubs or gallops.  Abdomen:  Soft and nontender.  Extremities:  No significant edema.  Neurological:  Grossly normal.

## 2010-03-17 NOTE — Patient Instructions (Signed)
English   Spanish  Healthy Upper Back: Exercises  Your Care Instructions  Here are some examples of exercises for your upper back. Start each exercise slowly. Ease off the exercise if you start to have pain.  Your doctor or physical therapist will tell you when you can start these exercises and which ones will work best for you.  How to do the exercises  Lower neck and upper back stretch      1. Stretch your arms out in front of your body. Clasp one hand on top of your other hand.   2. Gently reach out so that you feel your shoulder blades stretching away from each other.   3. Gently bend your head forward.   4. Hold for 15 to 30 seconds.   5. Repeat 2 to 4 times.  Midback stretch      Note: If you have knee pain, do not do this exercise.  1. Kneel on the floor, and sit back on your ankles.   2. Lean forward, place your hands on the floor, and stretch your arms out in front of you. Rest your head between your arms.   3. Gently push your chest toward the floor, reaching as far in front of you as possible.   4. Hold for 15 to 30 seconds.   5. Repeat 2 to 4 times.  Shoulder rolls      1. Sit comfortably with your feet shoulder-width apart. You can also do this exercise while standing.   2. Roll your shoulders up, then back, and then down in a smooth, circular motion.   3. Repeat 2 to 4 times.  Wall push-up      1. Stand against a wall with your feet about 12 to 24 inches back from the wall. If you feel any pain when you do this exercise, stand closer to the wall.   2. Place your hands on the wall slightly wider apart than your shoulders, and lean forward.   3. Gently lean your body toward the wall. Then push back to your starting position. Keep the motion smooth and controlled.   4. Repeat 8 to 12 times.  Resisted shoulder blade squeeze      Note: For this exercise, you will need elastic exercise material, such as surgical tubing or Thera-band.   1. Sit or stand, holding the band in both hands in front of you. Keep your elbows close to your sides, bent at a 90-degree angle. Your palms should face up.   2. Squeeze your shoulder blades together, and move your arms to the outside, stretching the band. Be sure to keep your elbows at your sides while you do this.   3. Relax.   4. Repeat 8 to 12 times.  Resisted rows      Note: For this exercise, you will need elastic exercise material, such as surgical tubing or Thera-band.  1. Put the band around a solid object, such as a bedpost, at about waist level. Hold one end of the band in each hand.   2. With your elbows at your sides and bent to 90 degrees, pull the band back to move your shoulder blades toward each other. Return to the starting position.   3. Repeat 8 to 12 times.  Follow-up care is a key part of your treatment and safety. Be sure to make and go to all appointments, and call your doctor if you are having problems. It's also a good idea to know   your test results and keep a list of the medicines you take.     Where can you learn more?   Go to http://www.healthwise.net/[facility]  Enter T680 in the search box to learn more about "Healthy Upper Back: Exercises."   ?? 2006-2011 [employer]. Care instructions adapted under license by [employer] (which disclaims liability or warranty for this information). This care instruction is for use with your licensed healthcare professional. If you have questions about a medical condition or this instruction, always ask your healthcare professional. Healthwise, Incorporated disclaims any warranty or liability for your use of this information.  Content Version: 8.9.83828; Last Revised: October 24, 2008

## 2010-03-17 NOTE — Progress Notes (Signed)
Chief Complaint   Patient presents with   ??? Back Pain       she is a 39 y.o. year old female who presents with the following issues:  1.  Pain in her back in the lower thoracic area.    Onset of Symptoms: a couple months  Frequency: all day, nagging pain  Duration: continuous  Hx of similar symptoms: No  Pain Scale:(1-10): 5  Radiation: no just paraspinal area  Progression: has worsened slightly  What makes it better?: sitting  What makes it worse?:walking and standing all the time  Medications tried: ibuprofen    Has not had any more syncopal episode.  Still goes to the bathroom a lot but also drinks a lot.   She is happy she had the procedure done and is following with Dr. Doristine Johns in the near future.  Smoking Status:  Former smoker    Patient Active Problem List   Diagnoses Code   ??? Acne 706.1AQ   ??? Seborrheic eczema 690.18Q   ??? Cyst of ovary 620.2AZ   ??? Palpitation 785.1A   ??? Syncope and collapse 780.2   ??? Vitamin D deficiency 268.9G   ??? Arrhythmia 427.9D   ??? 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 Cholecalciferol, Vitamin D3, (VITAMIN D) 2,000 unit Cap Take 2,000 Units by mouth daily.  90 Tab  2   ??? clindamycin-benzoyl peroxide (DUAC) 1-5 % topical gel Apply  to affected area two (2) times a day. Apply to affected area after the skin has been cleansed and dried.  1 Tube  0       Allergies   Allergen Reactions   ??? Pcn (Penicillins) Unknown (comments)   ??? Septra (Sulfamethoprim Ds) Unknown (comments)          ROS: negative for - chills, fever, night sweats or weight loss  Ophthalmic ROS: negative for - blurry vision, double vision, eye pain or photophobia  Endocrine ROS: negative for - mood swings, polydipsia/polyuria, temperature intolerance or unexpected weight changes  Respiratory ROS: no cough, shortness of breath, or wheezing  Cardiovascular ROS: no chest pain or dyspnea on exertion   Gastrointestinal ROS: no abdominal pain, change in bowel habits, or black or bloody stools  Genito-Urinary ROS: no dysuria, trouble voiding, or hematuria  Musculoskeletal ROS: positive for muscle pain in the mid thoracic area.  Neurological ROS: negative for - headaches, numbness/tingling or visual changes      Objective:   Filed Vitals:    03/17/10 0928   BP: 119/79   Pulse: 68   Temp: 98.5 ??F (36.9 ??C)   TempSrc: Oral   Resp: 16   Height: 6' (1.829 m)   Weight: 218 lb (98.884 kg)       Cervical, thoracic and lumbar spine exam is normal without tenderness, masses or kyphoscoliosis.   Full range of motion without pain is noted.  Mild tenderness paraspinal muscles lower thoracic region  Chest clear, no wheezes and no crackles  S1 and S2 normal, no murmurs, clicks, gallops or rubs. Regular rate and rhythm. Chest is clear; no wheezes or rales. No edema or JVD.     Assessment/ Plan:   [place] was seen today for back pain.    Diagnoses and associated orders for this visit:    Thoracic back pain  - REFERRAL TO PHYSICAL THERAPY    Syncope and collapse:  Had not have  any event.    Arrhythmia:  Since she had the Electrophysiology.  She is feeling well.    Plan:    1.  Discussed the above diagnosis and plan.  2.  Discussed if she needs any medication for her back pain and she said no and will take OTC meds if needed.  3.  Answered any question that patient had.  4.  Patient instruction given regarding: back pain and some exercises to relieve pain  5.  I also advised patient if symptoms get worse to return to the clinic, otherwise will be  seen  for follow up in 3 months.  6.  Patient given an after-visit summary which has her current list of medications and the orders.       Patient in agreement with the above plan.

## 2010-03-17 NOTE — Progress Notes (Signed)
Chief Complaint   Patient presents with   ??? Back Pain

## 2013-07-08 ENCOUNTER — Encounter (HOSPITAL_COMMUNITY): Payer: Self-pay | Admitting: Emergency Medicine

## 2013-07-08 ENCOUNTER — Emergency Department (HOSPITAL_COMMUNITY)
Admission: EM | Admit: 2013-07-08 | Discharge: 2013-07-08 | Disposition: A | Discharge status: Home / Self Care | Payer: BC Managed Care – PPO | Attending: Emergency Medicine | Admitting: Emergency Medicine

## 2013-07-08 ENCOUNTER — Emergency Department (HOSPITAL_COMMUNITY): Payer: BC Managed Care – PPO

## 2013-07-08 DIAGNOSIS — M8530 Osteitis condensans, unspecified site: Secondary | ICD-10-CM | POA: Insufficient documentation

## 2013-07-08 DIAGNOSIS — M8538 Osteitis condensans, other site: Secondary | ICD-10-CM

## 2013-07-08 DIAGNOSIS — M545 Low back pain, unspecified: Secondary | ICD-10-CM | POA: Insufficient documentation

## 2013-07-08 DIAGNOSIS — Z880 Allergy status to penicillin: Secondary | ICD-10-CM | POA: Insufficient documentation

## 2013-07-08 DIAGNOSIS — Z87891 Personal history of nicotine dependence: Secondary | ICD-10-CM | POA: Insufficient documentation

## 2013-07-08 DIAGNOSIS — R109 Unspecified abdominal pain: Secondary | ICD-10-CM

## 2013-07-08 DIAGNOSIS — IMO0002 Reserved for concepts with insufficient information to code with codable children: Secondary | ICD-10-CM

## 2013-07-08 DIAGNOSIS — M2550 Pain in unspecified joint: Secondary | ICD-10-CM

## 2013-07-08 LAB — COMPREHENSIVE METABOLIC PANEL
ALT: 12 U/L (ref 0–35)
Alkaline Phosphatase: 66 U/L (ref 39–117)
Chloride: 99 mEq/L (ref 96–112)
GFR calc Af Amer: >90 mL/min (ref >90)
Sodium: 134 mEq/L — ABNORMAL LOW (ref 135–145)

## 2013-07-08 LAB — URINALYSIS, ROUTINE W REFLEX MICROSCOPIC
Bilirubin Urine: NEGATIVE
Glucose, UA: NEGATIVE mg/dL
Leukocytes, UA: NEGATIVE
Nitrite: NEGATIVE
Urobilinogen, UA: 0.2 mg/dL (ref 0.0–1.0)

## 2013-07-08 LAB — CBC WITH DIFFERENTIAL/PLATELET
Basophils Absolute: 0.1 10*3/uL (ref 0.0–0.1)
Eosinophils Absolute: 0.2 10*3/uL (ref 0.0–0.7)
Eosinophils Relative: 4 % (ref 0–5)
Lymphocytes Relative: 31 % (ref 12–46)
Lymphs Abs: 2 10*3/uL (ref 0.7–4.0)
MCHC: 34.8 g/dL (ref 30.0–36.0)
Neutrophils Relative %: 57 % (ref 43–77)
RBC: 4.69 MIL/uL (ref 3.87–5.11)
WBC: 6.3 10*3/uL (ref 4.0–10.5)

## 2013-07-08 LAB — SEDIMENTATION RATE: Sed Rate: 3 mm/hr (ref 0–22)

## 2013-07-08 MED ORDER — IOHEXOL 300 MG/ML  SOLN
100.0000 mL | Freq: Once | INTRAMUSCULAR | Status: AC | PRN
  Administered 2013-07-08: 100 mL via INTRAVENOUS

## 2013-07-08 MED ORDER — IOHEXOL 300 MG/ML  SOLN: 50.0000 mL | Freq: Once | INTRAMUSCULAR | Status: DC | PRN

## 2013-07-08 NOTE — ED Notes (Signed)
Pt also c/o of joint pain that increases over the past several days  like a burning sensation

## 2013-07-08 NOTE — ED Provider Notes (Signed)
Medical screening examination/treatment/procedure(s) were conducted as a shared visit with non-physician practitioner(s) and myself.  I personally evaluated the patient during the encounter.  Patient is awake and alert in no acute distress. She reports her pain decreased from an 8 to a 4. Her ED stay.  Ivonne Andrew and I spent about 25 minutes with her reviewing her labs and CT findings with her. At this time there is no finding to explain her symptomatology. Her ANA, sedimentation rate and CRP are all within normal limits making an autoimmune or rheumatologic etiology less likely. At her request we've provided her with followup options.  Hanley Seamen, MD 07/08/13 [phone]

## 2013-07-08 NOTE — ED Notes (Signed)
Pt states bilateral flank pain, dry mouth, generalize "feeling bad" for one month. Pt states unable to be seen pmd until 11/15.

## 2013-07-08 NOTE — ED Provider Notes (Signed)
CSN: [id number]     Arrival date & time 07/08/13  0005 History   First MD Initiated Contact with Patient 07/08/13 0201     Chief Complaint  Patient presents with  . Flank Pain   HPI  History provided by the patient. Patient is a 42 year old female with no significant PMH who presents with concerns for bilateral lower flank and back pain and soreness. Patient states that she has actually had several symptoms over the past several months consisting of joint pains. She reports one episode several weeks ago of right shoulder pain causing her difficulty moving and operating the stick shift in her car. She also had an episode of severe neck pain in the afternoon causing her difficulty moving her head and neck. The symptoms lasted only one day. She has also complained of general "bad feeling" for the one month. She has had some worsening dry mouth and occasional dry eyes.   Patient also reports an episodes of problems with her memory and states that she is usually very sharp and does not have any problems but while at work she was having difficulty remembering automotive parts and it took her several minutes to even think of what she was meaning to say. She states on a separate episode of her coworker thought that she was slurring her speech.  She also complains of worsened in persistent low back ache and flank pain. Patient did try to have laboratory testing from an independent lab facility due to her symptoms. She also made an appointment with her primary care provider but states this is her first appointment and she is not able to be seen until November 10. Patient states she is very concerned of something causing her discomforts and symptoms. She denies any recent fever, night sweats or weight change. She denies any urinary changes.    History reviewed. No pertinent past medical history. Past Surgical History  Procedure Laterality Date  . Cardiac electrophysiology study and ablation    . Anterior  cruciate ligament repair    . Nasal reconstruction with septal repair     History reviewed. No pertinent family history. History  Substance Use Topics  . Smoking status: Former Games developer  . Smokeless tobacco: Not on file  . Alcohol Use: Yes   OB History   Grav Para Term Preterm Abortions TAB SAB Ect Mult Living                 Review of Systems  Constitutional: Negative for fever, diaphoresis, activity change, appetite change and unexpected weight change.  Respiratory: Negative for shortness of breath.   Cardiovascular: Negative for chest pain.  Gastrointestinal: Negative for nausea, vomiting and abdominal pain.  Endocrine: Negative for cold intolerance, heat intolerance, polyphagia and polyuria.  Genitourinary: Negative for dysuria, frequency, hematuria and flank pain.  Musculoskeletal: Positive for arthralgias and back pain.  All other systems reviewed and are negative.    Allergies  Sulfa antibiotics and Penicillins  Home Medications   Current Outpatient Rx  Name  Route  Sig  Dispense  Refill  . ibuprofen (ADVIL,MOTRIN) 200 MG tablet   Oral   Take 400 mg by mouth every 6 (six) hours as needed for pain.          BP 156/91  Temp(Src) 98.1 F (36.7 C) (Oral)  Resp 18  Ht 6' (1.829 m)  Wt 210 lb (95.255 kg)  BMI 28.47 kg/m2  SpO2 99%  LMP 07/08/2013 Physical Exam  Nursing note and vitals reviewed.  Constitutional: She is oriented to person, place, and time. She appears well-developed and well-nourished. No distress.  HENT:  Head: Normocephalic.  Eyes: Conjunctivae and EOM are normal. Pupils are equal, round, and reactive to light.  Neck: Normal range of motion. Neck supple. No tracheal deviation present.  Cardiovascular: Normal rate and regular rhythm.   No murmur heard. Pulmonary/Chest: Effort normal and breath sounds normal. No respiratory distress. She has no wheezes. She has no rales.  Abdominal: Soft. She exhibits no mass. There is no tenderness. There is  no rebound and no guarding.  Musculoskeletal: Normal range of motion. She exhibits no edema.       Cervical back: Normal.       Thoracic back: Normal.       Lumbar back: She exhibits tenderness. She exhibits no bony tenderness and no deformity.       Back:  Neurological: She is alert and oriented to person, place, and time. She has normal strength. No cranial nerve deficit or sensory deficit.  Skin: Skin is warm and dry. No rash noted.  Psychiatric: She has a normal mood and affect. Her behavior is normal.    ED Course  Procedures   Patient seen and evaluated. She is sitting appears comfortable reading a novel in no acute distress. She does not appear severely ill or toxic. Patient has multiple complaints from the previous several months.  Patient's lab testing without any concerning findings. I attempted to reassure the patient that she did not appear to have any signs for concerning or emergent condition based on her history, exam and lab tests. Patient did continue to be very concerned of some "bad cause" of her flank pains and "kidney problems". I continued to evaluate calmly and professionally explained that from the emergency room standpoint there were no significant indications that she required emergent imaging or further testing. However under further duress I agreed to order a CAT scan for evaluation of a possible concerning cause of her flank and low back pains and discomforts. I am doubtful that the CAT scan will show any cause to explain her other multiple symptoms of arthralgias, dry mouth and memory concerns.  Patient was discussed with attending physician who also evaluated copies of her recent lab testing which were quite extensive and did not show any concerning findings including negative STD panel, Lipid panel, ANA, dsDNA, CBC, CMP, TSH, Estradiol, UA, CRP, Anemia profile.   5:50AM CT scan reviewed with attending physician. No signs for a concerning cause of patient's  symptoms today. Patient's lab testing and CT scan results were discussed with her in the room by attending physician. She was encouraged to followup with her primary care provider. We also discussed given referrals to the [facility] urgent care where she might have a sooner appointment. And we also gave her referral for rheumatology specialist. Patient expressed her understanding and agrees with plan to followup.   Results for orders placed during the hospital encounter of 07/08/13  URINALYSIS, ROUTINE W REFLEX MICROSCOPIC      Result Value Range   Color, Urine YELLOW  YELLOW   APPearance CLEAR  CLEAR   Specific Gravity, Urine 1.014  1.005 - 1.030   pH 6.0  5.0 - 8.0   Glucose, UA NEGATIVE  NEGATIVE mg/dL   Hgb urine dipstick NEGATIVE  NEGATIVE   Bilirubin Urine NEGATIVE  NEGATIVE   Ketones, ur NEGATIVE  NEGATIVE mg/dL   Protein, ur NEGATIVE  NEGATIVE mg/dL   Urobilinogen, UA 0.2  0.0 -  1.0 mg/dL   Nitrite NEGATIVE  NEGATIVE   Leukocytes, UA NEGATIVE  NEGATIVE  CBC WITH DIFFERENTIAL      Result Value Range   WBC 6.3  4.0 - 10.5 K/uL   RBC 4.69  3.87 - 5.11 MIL/uL   Hemoglobin 14.1  12.0 - 15.0 g/dL   HCT 78.2  95.6 - 21.3 %   MCV 86.4  78.0 - 100.0 fL   MCH 30.1  26.0 - 34.0 pg   MCHC 34.8  30.0 - 36.0 g/dL   RDW 08.6  57.8 - 46.9 %   Platelets 308  150 - 400 K/uL   Neutrophils Relative % 57  43 - 77 %   Neutro Abs 3.6  1.7 - 7.7 K/uL   Lymphocytes Relative 31  12 - 46 %   Lymphs Abs 2.0  0.7 - 4.0 K/uL   Monocytes Relative 7  3 - 12 %   Monocytes Absolute 0.5  0.1 - 1.0 K/uL   Eosinophils Relative 4  0 - 5 %   Eosinophils Absolute 0.2  0.0 - 0.7 K/uL   Basophils Relative 1  0 - 1 %   Basophils Absolute 0.1  0.0 - 0.1 K/uL  COMPREHENSIVE METABOLIC PANEL      Result Value Range   Sodium 134 (*) 135 - 145 mEq/L   Potassium 3.3 (*) 3.5 - 5.1 mEq/L   Chloride 99  96 - 112 mEq/L   CO2 27  19 - 32 mEq/L   Glucose, Bld 95  70 - 99 mg/dL   BUN 15  6 - 23 mg/dL   Creatinine, Ser  6.29  0.50 - 1.10 mg/dL   Calcium 9.7  8.4 - 52.8 mg/dL   Total Protein 7.3  6.0 - 8.3 g/dL   Albumin 4.0  3.5 - 5.2 g/dL   AST 15  0 - 37 U/L   ALT 12  0 - 35 U/L   Alkaline Phosphatase 66  39 - 117 U/L   Total Bilirubin 0.2 (*) 0.3 - 1.2 mg/dL   GFR calc non Af Amer >90  >90 mL/min   GFR calc Af Amer >90  >90 mL/min  SEDIMENTATION RATE      Result Value Range   Sed Rate 3  0 - 22 mm/hr   Ct Abdomen Pelvis W Contrast  07/08/2013   CLINICAL DATA:  Flank pain.  EXAM: CT ABDOMEN AND PELVIS WITH CONTRAST  TECHNIQUE: Multidetector CT imaging of the abdomen and pelvis was performed using the standard protocol following bolus administration of intravenous contrast.  CONTRAST:  OMNIPAQUE IOHEXOL 300 MG/ML  SOLN  COMPARISON:  None available for comparison at time of study interpretation.  FINDINGS: Included view of the lung bases are clear. Visualized heart and pericardium are unremarkable.  The liver, spleen, gallbladder, pancreas and adrenal glands are unremarkable.  The stomach, small and large bowel are normal in course and caliber without inflammatory changes sensitive to decreased by lack of enteric contrast. Normal appendix. No intraperitoneal free fluid nor free air.  Kidneys are orthotopic, demonstrating symmetric enhancement without nephrolithiasis, hydronephrosis or renal masses. The unopacified ureters are normal in course and caliber. Delayed imaging through the kidneys demonstrates symmetric prompt excretion to the proximal urinary collecting system. Urinary bladder is partially distended and unremarkable.  Great vessels are normal in course and caliber. No lymphadenopathy by CT size criteria. Small scattered mesenteric lymph nodes most node in the left upper quadrant. Internal reproductive organs are nonsuspicious ;  2 air-filled structures in the vagina likely reflect tampons. Geographic sclerosis about the sacroiliac joints bilaterally suggests osteitis condensans ilia, with  superimposed cyst on the left. Scattered bone islands. Mild L5-S1 degenerative disc disease and moderate L4-5 facet arthropathy.  IMPRESSION: No acute intra-abdominal or pelvic process, no urolithiasis or specific findings to explain flank pain though there is moderate L4-5 facet arthropathy and bilateral osteitis condensans ilii.   Electronically Signed   By: Awilda Metro   On: 07/08/2013 05:44        MDM   1. Degenerative disc disease   2. Osteitis condensans ilii   3. Flank pain   4. Arthralgia          Angus Seller, PA-C 07/08/13 [phone]

## 2015-12-08 NOTE — Telephone Encounter (Signed)
Patient called to request that Dr. Doristine JohnsSeutter complete [employer] letter for service related injury. Patient was last seen on 03/11/2010. Patient will fax over necessary documents from [employer]. Patient informed that Dr. Doristine JohnsSeutter will return to the office on Thursday and I will review faxed documents with Dr. Doristine JohnsSeutter. She verbalized understanding.  Beather ArbourLatisha R Bostyn Bogie, LPN

## 2015-12-10 NOTE — Telephone Encounter (Signed)
Patient was informed via phone that she has to schedule a follow up appointment with Dr. Doristine JohnsSeutter. Patient declined. She was informed she would not be turned away due to inability to pay. She was informed requested documentation could not be completed without a follow up appointment. Patient was last seen in 2011. Patient was informed to call the office if she would like to schedule the appointment.  Beather ArbourLatisha R Annelyse Rey, LPN

## 2015-12-10 NOTE — Telephone Encounter (Signed)
I discussed with Dr. Doristine JohnsSeutter, since patient has not been seen since 2011 she has to schedule an appointment. I attempted to reach patient via phone, no answer and voice mail has not been set up. Patient was instructed to call office today at noon when I spoke with her yesterday. I will inform her then, if I do not hear from her I will mail a letter to the address on file. Beather ArbourLatisha R Meztli Llanas, LPN

## 2019-09-05 ENCOUNTER — Ambulatory Visit: Payer: 59 | Attending: Internal Medicine

## 2019-09-05 DIAGNOSIS — Z20822 Contact with and (suspected) exposure to covid-19: Secondary | ICD-10-CM

## 2019-09-07 LAB — NOVEL CORONAVIRUS, NAA: SARS-CoV-2, NAA: NOT DETECTED

## 2020-09-12 HISTORY — PX: LOOP RECORDER IMPLANT: SHX5954

## 2021-06-03 ENCOUNTER — Other Ambulatory Visit: Payer: Self-pay

## 2021-06-03 ENCOUNTER — Encounter (HOSPITAL_BASED_OUTPATIENT_CLINIC_OR_DEPARTMENT_OTHER): Payer: Self-pay | Admitting: Obstetrics and Gynecology

## 2021-06-03 ENCOUNTER — Emergency Department (HOSPITAL_BASED_OUTPATIENT_CLINIC_OR_DEPARTMENT_OTHER)
Admission: EM | Admit: 2021-06-03 | Discharge: 2021-06-03 | Disposition: A | Discharge status: Home / Self Care | Payer: 59 | Attending: Emergency Medicine | Admitting: Emergency Medicine

## 2021-06-03 DIAGNOSIS — R000 Tachycardia, unspecified: Secondary | ICD-10-CM | POA: Diagnosis not present

## 2021-06-03 DIAGNOSIS — Z5321 Procedure and treatment not carried out due to patient leaving prior to being seen by health care provider: Secondary | ICD-10-CM | POA: Insufficient documentation

## 2021-06-03 DIAGNOSIS — I10 Essential (primary) hypertension: Secondary | ICD-10-CM | POA: Diagnosis not present

## 2021-06-03 DIAGNOSIS — R42 Dizziness and giddiness: Secondary | ICD-10-CM | POA: Diagnosis present

## 2021-06-03 HISTORY — DX: Essential (primary) hypertension: I10

## 2021-06-03 NOTE — ED Triage Notes (Signed)
Patient reports to the ER for tachycardia and HTN. Patient reports she has a loop recorder and has had an ablation. States lots of stress at home right now due to the untimely passing of her sister and a debt collector called and was attempting to take away personal possessions of her deceased sister to pay off estate debts and patient felt dizzy, tachycardic, and her BP was 168/106 at home.

## 2021-07-03 ENCOUNTER — Emergency Department (HOSPITAL_BASED_OUTPATIENT_CLINIC_OR_DEPARTMENT_OTHER)
Admission: EM | Admit: 2021-07-03 | Discharge: 2021-07-03 | Disposition: A | Discharge status: Home / Self Care | Payer: No Typology Code available for payment source | Attending: Emergency Medicine | Admitting: Emergency Medicine

## 2021-07-03 ENCOUNTER — Emergency Department (HOSPITAL_BASED_OUTPATIENT_CLINIC_OR_DEPARTMENT_OTHER): Payer: No Typology Code available for payment source | Admitting: Radiology

## 2021-07-03 ENCOUNTER — Other Ambulatory Visit: Payer: Self-pay

## 2021-07-03 ENCOUNTER — Encounter (HOSPITAL_BASED_OUTPATIENT_CLINIC_OR_DEPARTMENT_OTHER): Payer: Self-pay | Admitting: *Deleted

## 2021-07-03 DIAGNOSIS — R052 Subacute cough: Secondary | ICD-10-CM | POA: Diagnosis not present

## 2021-07-03 DIAGNOSIS — R0602 Shortness of breath: Secondary | ICD-10-CM | POA: Diagnosis present

## 2021-07-03 HISTORY — DX: Unspecified osteoarthritis, unspecified site: M19.90

## 2021-07-03 HISTORY — DX: Malignant (primary) neoplasm, unspecified: C80.1

## 2021-07-03 LAB — CBC WITH DIFFERENTIAL/PLATELET
Abs Immature Granulocytes: 0.01 10*3/uL (ref 0.00–0.07)
Basophils Absolute: 0.1 10*3/uL (ref 0.0–0.1)
Basophils Relative: 1 %
Eosinophils Absolute: 0.3 10*3/uL (ref 0.0–0.5)
Eosinophils Relative: 5 %
HCT: 42.4 % (ref 36.0–46.0)
Hemoglobin: 13.9 g/dL (ref 12.0–15.0)
Immature Granulocytes: 0 %
Lymphocytes Relative: 29 %
Lymphs Abs: 2 10*3/uL (ref 0.7–4.0)
MCH: 28.4 pg (ref 26.0–34.0)
MCHC: 32.8 g/dL (ref 30.0–36.0)
MCV: 86.5 fL (ref 80.0–100.0)
Monocytes Absolute: 0.4 10*3/uL (ref 0.1–1.0)
Monocytes Relative: 6 %
Neutro Abs: 4.2 10*3/uL (ref 1.7–7.7)
Neutrophils Relative %: 59 %
Platelets: 349 10*3/uL (ref 150–400)
RBC: 4.9 MIL/uL (ref 3.87–5.11)
RDW: 13.1 % (ref 11.5–15.5)
WBC: 7 10*3/uL (ref 4.0–10.5)
nRBC: 0 % (ref 0.0–0.2)

## 2021-07-03 LAB — BASIC METABOLIC PANEL
Anion gap: 10 (ref 5–15)
BUN: 15 mg/dL (ref 6–20)
CO2: 26 mmol/L (ref 22–32)
Calcium: 9.4 mg/dL (ref 8.9–10.3)
Chloride: 104 mmol/L (ref 98–111)
Creatinine, Ser: 0.59 mg/dL (ref 0.44–1.00)
GFR, Estimated: >60 mL/min (ref >60)
Glucose, Bld: 108 mg/dL — ABNORMAL HIGH (ref 70–99)
Potassium: 3.8 mmol/L (ref 3.5–5.1)
Sodium: 140 mmol/L (ref 135–145)

## 2021-07-03 MED ORDER — ALBUTEROL SULFATE HFA 108 (90 BASE) MCG/ACT IN AERS: 1.0000 | INHALATION_SPRAY | Freq: Four times a day (QID) | RESPIRATORY_TRACT | 0 refills | Status: AC | PRN

## 2021-07-03 MED ORDER — HYDROCOD POLST-CPM POLST ER 10-8 MG/5ML PO SUER
5.0000 mL | Freq: Once | ORAL | Status: AC
  Administered 2021-07-03: 5 mL via ORAL
  Filled 2021-07-03: qty 5

## 2021-07-03 MED ORDER — BENZONATATE 100 MG PO CAPS: 100.0000 mg | ORAL_CAPSULE | Freq: Three times a day (TID) | ORAL | 0 refills | Status: AC

## 2021-07-03 NOTE — ED Provider Notes (Signed)
[facility] EMERGENCY DEPT Provider Note   CSN: [id number] Arrival date & time: 07/03/21  1615     History Chief Complaint  Patient presents with   Shortness of Breath    Tamara Ferguson is a 50 y.o. female with history of breast cancer who presents to the emergency department with a 1.5-week history of productive cough with yellow sputum and shortness of breath.  She states that her coworker had something similar over a week ago and she began having symptoms a couple days afterwards.  She has been taking over-the-counter Mucinex and Robitussin with little improvement.  She denies any fever, chills, abdominal pain, nausea, vomiting, urinary complaints.  Her cough is worse at night. She has tested negative for COVID twice with at home tests.   Shortness of Breath     Past Medical History:  Diagnosis Date   Arthritis    Cancer ([place])    Hypertension     There are no problems to display for this patient.   Past Surgical History:  Procedure Laterality Date   ABDOMINAL HYSTERECTOMY     ANTERIOR CRUCIATE LIGAMENT REPAIR     CARDIAC ELECTROPHYSIOLOGY STUDY AND ABLATION     LOOP RECORDER IMPLANT  2022   MASTECTOMY     NASAL RECONSTRUCTION WITH SEPTAL REPAIR       OB History     Gravida  0   Para  0   Term  0   Preterm  0   AB  0   Living  0      SAB  0   IAB  0   Ectopic  0   Multiple  0   Live Births  0           No family history on file.  Social History   Tobacco Use   Smoking status: Former    Passive exposure: Never  Scientific laboratory technician Use: Never used  Substance Use Topics   Alcohol use: Yes   Drug use: Never    Home Medications Prior to Admission medications   Medication Sig Start Date End Date Taking? Authorizing Provider  albuterol (VENTOLIN HFA) 108 (90 Base) MCG/ACT inhaler Inhale 1-2 puffs into the lungs every 6 (six) hours as needed for wheezing or shortness of breath. 07/03/21  Yes Raul Del, Quadre Bristol M, PA-C   benzonatate (TESSALON) 100 MG capsule Take 1 capsule (100 mg total) by mouth every 8 (eight) hours. 07/03/21  Yes Raul Del, Diane Mochizuki M, PA-C  ibuprofen (ADVIL,MOTRIN) 200 MG tablet Take 400 mg by mouth every 6 (six) hours as needed for pain.    [provider]    Allergies    Sulfa antibiotics and Penicillins  Review of Systems   Review of Systems  Respiratory:  Positive for shortness of breath.   All other systems reviewed and are negative.  Physical Exam Updated Vital Signs BP 128/79   Pulse 81   Temp 97.7 F (36.5 C) (Oral)   Resp 18   LMP 07/08/2013   SpO2 100%   Physical Exam Vitals and nursing note reviewed.  Constitutional:      General: She is not in acute distress.    Appearance: Normal appearance.  HENT:     Head: Normocephalic and atraumatic.  Eyes:     General:        Right eye: No discharge.        Left eye: No discharge.  Cardiovascular:     Comments: Regular rate and  rhythm.  S1/S2 are distinct without any evidence of murmur, rubs, or gallops.  Radial pulses are 2+ bilaterally.  Dorsalis pedis pulses are 2+ bilaterally.  No evidence of pedal edema. Pulmonary:     Comments: Clear to auscultation bilaterally.  Normal effort.  No respiratory distress.  No evidence of wheezes, rales, or rhonchi heard throughout. Abdominal:     General: Abdomen is flat. Bowel sounds are normal. There is no distension.     Tenderness: There is no abdominal tenderness. There is no guarding or rebound.  Musculoskeletal:        General: Normal range of motion.     Cervical back: Neck supple.  Skin:    General: Skin is warm and dry.     Findings: No rash.  Neurological:     General: No focal deficit present.     Mental Status: She is alert.  Psychiatric:        Mood and Affect: Mood normal.        Behavior: Behavior normal.    ED Results / Procedures / Treatments   Labs (all labs ordered are listed, but only abnormal results are displayed) Labs Reviewed  BASIC  METABOLIC PANEL - Abnormal; Notable for the following components:      Result Value   Glucose, Bld 108 (*)    All other components within normal limits  CBC WITH DIFFERENTIAL/PLATELET    EKG EKG Interpretation  Date/Time:  Saturday July 03 2021 16:47:40 EDT Ventricular Rate:  87 PR Interval:  158 QRS Duration: 86 QT Interval:  336 QTC Calculation: 404 R Axis:   63 Text Interpretation: Normal sinus rhythm Cannot rule out Anterior infarct , age undetermined Abnormal ECG No significant change since last tracing Confirmed by Calvert Cantor [phone]) on 07/03/2021 5:02:13 PM  Radiology DG Chest 2 View  Result Date: 07/03/2021 CLINICAL DATA:  Shortness of breath. EXAM: CHEST - 2 VIEW COMPARISON:  09/04/2006 FINDINGS: Heart size is normal. Lungs are free of focal consolidations and pleural effusions. Loop recorder overlies the anterior mid chest. IMPRESSION: No active cardiopulmonary disease. Electronically Signed   By: Nolon Nations M.D.   On: 07/03/2021 17:18    Procedures Procedures   Medications Ordered in ED Medications  chlorpheniramine-HYDROcodone (TUSSIONEX) 10-8 MG/5ML suspension 5 mL (5 mLs Oral Given 07/03/21 1940)    ED Course  I have reviewed the triage vital signs and the nursing notes.  Pertinent labs & imaging results that were available during my care of the patient were reviewed by me and considered in my medical decision making (see chart for details).    MDM Rules/Calculators/A&P                          [name] is a 50 y.o. female who presents to the emergency department for further evaluation of cough most consistent with viral upper respiratory infection.  Her presentation is not consistent with acute bacterial pneumonia, asthma, influenza, GERD, postnasal discharge, or medication side effects.  CBC and BMP were normal. I have a low suspicion for PE at this time. Chest x-ray did not reveal any pneumothorax or pneumonia.  She is feeling better  after Tussionex.  I will discharge her with Tessalon Perles for cough and inhaler for wheezing per patient request. Patient is amendable to this plan.  She is safe for discharge.   Final Clinical Impression(s) / ED Diagnoses Final diagnoses:  Subacute cough    Rx / DC Orders  ED Discharge Orders          Ordered    benzonatate (TESSALON) 100 MG capsule  Every 8 hours        07/03/21 2057    albuterol (VENTOLIN HFA) 108 (90 Base) MCG/ACT inhaler  Every 6 hours PRN        07/03/21 2057             Hendricks Limes, PA-C 07/03/21 2058    Truddie Hidden, MD 07/03/21 2255

## 2021-07-03 NOTE — ED Notes (Signed)
Pt verbalizes understanding of discharge instructions. Opportunity for questioning and answers were provided. Armand removed by staff, pt discharged from ED to home. Educated to pick up Rx.  

## 2021-07-03 NOTE — Discharge Instructions (Addendum)
You were seen and evaluated in the emergency department today for evaluation of cough.  As we discussed, your blood work and imaging are all normal.  This is likely a viral upper respiratory infection or possibly bronchitis.  I have given you Tessalon Perles for cough and albuterol inhaler for wheezing.  Please return to the emergency department if you experience worsening cough, fever, worsening shortness of breath, chest pain, loss of consciousness, or any other concerns you might have.

## 2021-07-03 NOTE — ED Triage Notes (Signed)
Pt reports productive cough, SOB and fatigue with congestion for 1 week.  Pt states chest pain with coughing and DOE.

## 2022-09-20 ENCOUNTER — Emergency Department (HOSPITAL_BASED_OUTPATIENT_CLINIC_OR_DEPARTMENT_OTHER): Payer: No Typology Code available for payment source

## 2022-09-20 ENCOUNTER — Emergency Department (HOSPITAL_COMMUNITY): Payer: No Typology Code available for payment source

## 2022-09-20 ENCOUNTER — Encounter (HOSPITAL_BASED_OUTPATIENT_CLINIC_OR_DEPARTMENT_OTHER): Payer: Self-pay | Admitting: Emergency Medicine

## 2022-09-20 ENCOUNTER — Other Ambulatory Visit: Payer: Self-pay

## 2022-09-20 ENCOUNTER — Emergency Department (HOSPITAL_BASED_OUTPATIENT_CLINIC_OR_DEPARTMENT_OTHER)
Admission: EM | Admit: 2022-09-20 | Discharge: 2022-09-21 | Disposition: A | Discharge status: Home / Self Care | Payer: No Typology Code available for payment source | Attending: Emergency Medicine | Admitting: Emergency Medicine

## 2022-09-20 DIAGNOSIS — I10 Essential (primary) hypertension: Secondary | ICD-10-CM | POA: Insufficient documentation

## 2022-09-20 DIAGNOSIS — R0981 Nasal congestion: Secondary | ICD-10-CM

## 2022-09-20 DIAGNOSIS — Z859 Personal history of malignant neoplasm, unspecified: Secondary | ICD-10-CM | POA: Insufficient documentation

## 2022-09-20 DIAGNOSIS — R29810 Facial weakness: Secondary | ICD-10-CM | POA: Diagnosis present

## 2022-09-20 DIAGNOSIS — G510 Bell's palsy: Secondary | ICD-10-CM | POA: Insufficient documentation

## 2022-09-20 LAB — COMPREHENSIVE METABOLIC PANEL
ALT: 26 U/L (ref 0–44)
AST: 16 U/L (ref 15–41)
Albumin: 4.6 g/dL (ref 3.5–5.0)
Alkaline Phosphatase: 119 U/L (ref 38–126)
Anion gap: 13 (ref 5–15)
BUN: 13 mg/dL (ref 6–20)
CO2: 23 mmol/L (ref 22–32)
Calcium: 10.1 mg/dL (ref 8.9–10.3)
Chloride: 102 mmol/L (ref 98–111)
Creatinine, Ser: 0.6 mg/dL (ref 0.44–1.00)
GFR, Estimated: >60 mL/min (ref >60)
Glucose, Bld: 109 mg/dL — ABNORMAL HIGH (ref 70–99)
Potassium: 3.9 mmol/L (ref 3.5–5.1)
Sodium: 138 mmol/L (ref 135–145)
Total Bilirubin: 0.5 mg/dL (ref 0.3–1.2)
Total Protein: 7.6 g/dL (ref 6.5–8.1)

## 2022-09-20 LAB — CBC
HCT: 41.3 % (ref 36.0–46.0)
Hemoglobin: 13.5 g/dL (ref 12.0–15.0)
MCH: 27.3 pg (ref 26.0–34.0)
MCHC: 32.7 g/dL (ref 30.0–36.0)
MCV: 83.4 fL (ref 80.0–100.0)
Platelets: 359 10*3/uL (ref 150–400)
RBC: 4.95 MIL/uL (ref 3.87–5.11)
RDW: 13.6 % (ref 11.5–15.5)
WBC: 6.6 10*3/uL (ref 4.0–10.5)
nRBC: 0 % (ref 0.0–0.2)

## 2022-09-20 LAB — PREGNANCY, URINE: Preg Test, Ur: NEGATIVE

## 2022-09-20 LAB — DIFFERENTIAL
Abs Immature Granulocytes: 0.01 10*3/uL (ref 0.00–0.07)
Basophils Absolute: 0.1 10*3/uL (ref 0.0–0.1)
Basophils Relative: 1 %
Eosinophils Absolute: 0.2 10*3/uL (ref 0.0–0.5)
Eosinophils Relative: 3 %
Immature Granulocytes: 0 %
Lymphocytes Relative: 26 %
Lymphs Abs: 1.8 10*3/uL (ref 0.7–4.0)
Monocytes Absolute: 0.4 10*3/uL (ref 0.1–1.0)
Monocytes Relative: 6 %
Neutro Abs: 4.2 10*3/uL (ref 1.7–7.7)
Neutrophils Relative %: 64 %

## 2022-09-20 LAB — CBG MONITORING, ED: Glucose-Capillary: 97 mg/dL (ref 70–99)

## 2022-09-20 LAB — PROTIME-INR
INR: 1 (ref 0.8–1.2)
Prothrombin Time: 12.7 seconds (ref 11.4–15.2)

## 2022-09-20 LAB — ETHANOL: Alcohol, Ethyl (B): <10 mg/dL (ref <10)

## 2022-09-20 LAB — APTT: aPTT: 31 seconds (ref 24–36)

## 2022-09-20 MED ORDER — VALACYCLOVIR HCL 500 MG PO TABS
1000.0000 mg | ORAL_TABLET | Freq: Once | ORAL | Status: AC
  Administered 2022-09-21: 1000 mg via ORAL
  Filled 2022-09-20: qty 2

## 2022-09-20 MED ORDER — VALACYCLOVIR HCL 1 G PO TABS: 1000.0000 mg | ORAL_TABLET | Freq: Three times a day (TID) | ORAL | 0 refills | Status: AC

## 2022-09-20 MED ORDER — GUAIFENESIN ER 600 MG PO TB12: 600.0000 mg | ORAL_TABLET | Freq: Two times a day (BID) | ORAL | 0 refills | Status: AC

## 2022-09-20 MED ORDER — PREDNISONE 10 MG PO TABS: 50.0000 mg | ORAL_TABLET | Freq: Every day | ORAL | 0 refills | Status: AC

## 2022-09-20 MED ORDER — PREDNISONE 5 MG PO TABS
50.0000 mg | ORAL_TABLET | Freq: Once | ORAL | Status: AC
  Administered 2022-09-21: 50 mg via ORAL
  Filled 2022-09-20: qty 2

## 2022-09-20 MED ORDER — LUBRICATING EYE DROPS 0.5-0.9 % OP SOLN: 1.0000 [drp] | Freq: Four times a day (QID) | OPHTHALMIC | 0 refills | Status: AC | PRN

## 2022-09-20 MED ORDER — PHENYLEPHRINE HCL 1 % NA SOLN: 1.0000 [drp] | Freq: Once | NASAL | 0 refills | Status: AC

## 2022-09-20 MED ORDER — SODIUM CHLORIDE 0.9% FLUSH
3.0000 mL | Freq: Once | INTRAVENOUS | Status: AC
  Administered 2022-09-20: 3 mL via INTRAVENOUS

## 2022-09-20 MED ORDER — LORAZEPAM 1 MG PO TABS
1.0000 mg | ORAL_TABLET | Freq: Once | ORAL | Status: AC
  Administered 2022-09-20: 1 mg via ORAL
  Filled 2022-09-20: qty 1

## 2022-09-20 NOTE — ED Provider Notes (Signed)
[facility] EMERGENC[facility] DEPT Provider Note   CSN: [id number] Arrival date & time: 09/20/22  1737     History  Chief Complaint  Patient presents with   Facial Droop    Tamara Ferguson is a 52 y.o. female.  HPI   Patient has a history of hypertension, arthritis, cancer, TIA who presents to the ED with complaints of facial palsy.  Patient states she was noticing some neck discomfort a couple days ago.  A couple of hours prior to arrival however she started to experiencing some slurring of her speech.  She also noticed that she was having difficulty closing her eyes and a drooping on the right side of her face.  Patient came to the ED.  Code stroke was activated at triage.  Patient denies any numbness or weakness in her arms and legs.  No vision issues.  Home Medications Prior to Admission medications   Medication Sig Start Date End Date Taking? Authorizing Provider  albuterol (VENTOLIN HFA) 108 (90 Base) MCG/ACT inhaler Inhale 1-2 puffs into the lungs every 6 (six) hours as needed for wheezing or shortness of breath. 07/03/21   Myna Bright M, PA-C  benzonatate (TESSALON) 100 MG capsule Take 1 capsule (100 mg total) by mouth every 8 (eight) hours. 07/03/21   Myna Bright M, PA-C  ibuprofen (ADVIL,MOTRIN) 200 MG tablet Take 400 mg by mouth every 6 (six) hours as needed for pain.    [provider]      Allergies    Sulfa antibiotics and Penicillins    Review of Systems   Review of Systems  Physical Exam Updated Vital Signs BP (!) 153/135   Pulse 70   Temp 98.8 F (37.1 C)   Resp (!) 25   LMP 07/08/2013   SpO2 100%  Physical Exam Vitals and nursing note reviewed.  Constitutional:      General: She is not in acute distress.    Appearance: She is well-developed.  HENT:     Head: Normocephalic and atraumatic.     Right Ear: External ear normal.     Left Ear: External ear normal.  Eyes:     General: No visual field deficit or scleral icterus.        Right eye: No discharge.        Left eye: No discharge.     Conjunctiva/sclera: Conjunctivae normal.  Neck:     Trachea: No tracheal deviation.  Cardiovascular:     Rate and Rhythm: Normal rate and regular rhythm.  Pulmonary:     Effort: Pulmonary effort is normal. No respiratory distress.     Breath sounds: Normal breath sounds. No stridor. No wheezing or rales.  Abdominal:     General: Bowel sounds are normal. There is no distension.     Palpations: Abdomen is soft.     Tenderness: There is no abdominal tenderness. There is no guarding or rebound.  Musculoskeletal:        General: No tenderness.     Cervical back: Neck supple.  Skin:    General: Skin is warm and dry.     Findings: No rash.  Neurological:     Mental Status: She is alert and oriented to person, place, and time.     Cranial Nerves: Cranial nerve deficit and facial asymmetry present. No dysarthria.     Sensory: No sensory deficit.     Motor: No abnormal muscle tone, seizure activity or pronator drift.     Coordination: Coordination normal.  Comments:  able to hold both legs off bed for 5 seconds, sensation intact in all extremities,  no left or right sided neglect,, no nystagmus noted  Right-sided facial palsy that involves the eyelid  Psychiatric:        Mood and Affect: Mood normal.     ED Results / Procedures / Treatments   Labs (all labs ordered are listed, but only abnormal results are displayed) Labs Reviewed  PROTIME-INR  APTT  CBC  DIFFERENTIAL  COMPREHENSIVE METABOLIC PANEL  ETHANOL  PREGNANCY, URINE  CBG MONITORING, ED  CBG MONITORING, ED    EKG EKG Interpretation  Date/Time:  Tuesday September 20 2022 18:29:49 EST Ventricular Rate:  68 PR Interval:  209 QRS Duration: 120 QT Interval:  376 QTC Calculation: 400 R Axis:   36 Text Interpretation: Sinus rhythm Borderline prolonged PR interval Probable left atrial enlargement Nonspecific intraventricular conduction delay No significant  change since last tracing Confirmed by Dorie Rank [phone]) on 09/20/2022 6:42:24 PM  Radiology CT HEAD CODE STROKE WO CONTRAST  Result Date: 09/20/2022 CLINICAL DATA:  Code stroke. Neuro deficit, acute, stroke suspected. Neck pain with tingling of the right side of the face and facial droop. Some slurred speech. EXAM: CT HEAD WITHOUT CONTRAST TECHNIQUE: Contiguous axial images were obtained from the base of the skull through the vertex without intravenous contrast. RADIATION DOSE REDUCTION: This exam was performed according to the departmental dose-optimization program which includes automated exposure control, adjustment of the mA and/or kV according to patient size and/or use of iterative reconstruction technique. COMPARISON:  None Available. FINDINGS: Brain: Normal appearance without evidence of old or acute infarction, mass lesion, hemorrhage, hydrocephalus or extra-axial collection. Vascular: No abnormal vascular finding. Skull: Normal Sinuses/Orbits: Clear/normal Other: None ASPECTS ([place] Stroke Program Early CT Score) - Ganglionic level infarction (caudate, lentiform nuclei, internal capsule, insula, M1-M3 cortex): 7 - Supraganglionic infarction (M4-M6 cortex): 3 Total score (0-10 with 10 being normal): 10 IMPRESSION: 1. Normal head CT. 2. Aspects is 10. These results were called by telephone at the time of interpretation on 09/20/2022 at 6:09 pm to provider [facility] , who verbally acknowledged these results. Electronically Signed   By: Nelson Chimes M.D.   On: 09/20/2022 18:12    Procedures Procedures    Medications Ordered in ED Medications  sodium chloride flush (NS) 0.9 % injection 3 mL (3 mLs Intravenous Given 09/20/22 1823)    ED Course/ Medical Decision Making/ A&P Clinical Course as of 09/20/22 [place] Sep 20, 2022  1812 Head CT normal.  Case discussed with Dr. Maree Erie [JK]  [phone] Initial labs reviewed.  CBC unremarkable.  Metabolic panel is currently pending. [NO]  6767 Head CT does  not show any acute abnormalities [JK]    Clinical Course User Index [JK] Dorie Rank, MD                           Medical Decision Making Differential diagnosis includes not limited to brain mass, TIA, Bell's palsy, stroke  Problems Addressed: Facial palsy: acute illness or injury that poses a threat to life or bodily functions  Amount and/or Complexity of Data Reviewed Labs: ordered. Decision-making details documented in ED Course. Radiology: ordered and independent interpretation performed. Discussion of management or test interpretation with external provider(s): Reviewed case with Dr. Malen Gauze, neurology.  He recommends MRI.  If MRI is negative patient can be discharged home with treatment for Bell's palsy.   Patient presents with  acute neurologic symptoms.  Presentation notable for right-sided facial palsy.  No numbness or weakness in her arms or legs.  Patient has notable asymmetry of her eyelid somewhat of her forehead.  Suggestive of Bell's palsy rather than stroke.  Patient however does have risk factors.  Patient was seen by neurology via telehealth services.  Dr. Malen Gauze recommends MRI emergently to rule out acute stroke.  If negative patient is stable for discharge and outpatient management.  If positive she will require admission.  MRI is not available at this facility.  Dr. Darl Householder accepts transfer to [facility] ED for MRI.        Final Clinical Impression(s) / ED Diagnoses Final diagnoses:  Facial palsy    Rx / DC Orders ED Discharge Orders     None         Dorie Rank, MD 09/20/22 1851

## 2022-09-20 NOTE — Discharge Instructions (Addendum)
You were seen in the emergency department for your facial droop.  Your MRI showed no signs of stroke and your symptoms are consistent with Bell's palsy.  This is treated with steroids as well as an antiviral medication and you can take these as prescribed.  I have also given you lubricating eyedrops that you should apply as prescribed and you should tape your eye shut at night to help prevent you from getting corneal abrasions from not being able to close your eyelid all the way.  You can follow-up with your primary doctor in the next few days to have your symptoms rechecked.  You should return to the emergency department if you are having numbness or weakness in your arms or legs, you changes to your vision, or if you have any other new or concerning symptoms.

## 2022-09-20 NOTE — ED Notes (Signed)
Transported with charge nurse to [place]

## 2022-09-20 NOTE — ED Triage Notes (Addendum)
Pain in neck since Saturday. Tingles on right side of face. Right eye difficult closing, some drooping on right side of face. Reports some speak slurring about 2 hours ago. Recent sinus , ear drainage. Ambulatory to triage. Reports some lightheaded..  Code stroke activated, CT notified of STAT code stroke pt, provider notified.

## 2022-09-20 NOTE — ED Notes (Signed)
Lauren @ [facility] will send transport asap. -ABB(NS)

## 2022-09-20 NOTE — Consult Note (Signed)
[facility] Telemedicine Consult   Requesting Provider: Dr. Tomi Bamberger Consult Participants: Dr. Jerelyn Charles, Telespecialist RN [place]   Bedside RN-[facility] RN Caren Griffins Location of the provider: Home Location of the patient: ER [facility]  This consult was provided via telemedicine with 2-way video and audio communication. The patient/family was informed that care would be provided in this way and agreed to receive care in this manner.   Chief Complaint: Right facial numbness and weakness, lightheadedness and tingling on the right face  HPI: 52 year old with past medical history of breast cancer, coronary artery disease, arthritis, presenting to the emergency room with complaints of right facial weakness and numbness along with lightheadedness.  The facial symptoms became more noticeable today sometime around 4 PM but she has been having some trouble, which she feels was like a sinus infection involving the right ear and also having some change in her taste that started a few days ago.  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  She did not make much of it and continue to work but decided to come to the ER only when the face seemed to start to tingle on that right side along with some slight facial droop on the right. Code stroke was activated at the outstanding ER.  Patient was evaluated on the camera, imaging reviewed.  See details below   Past Medical History:  Diagnosis Date   Arthritis    Cancer ([place])    Hypertension     No current facility-administered medications for this encounter.  Current Outpatient Medications:    albuterol (VENTOLIN HFA) 108 (90 Base) MCG/ACT inhaler, Inhale 1-2 puffs into the lungs every 6 (six) hours as needed for wheezing or shortness of breath., Disp: 18 g, Rfl: 0   benzonatate (TESSALON) 100 MG  capsule, Take 1 capsule (100 mg total) by mouth every 8 (eight) hours., Disp: 30 capsule, Rfl: 0   ibuprofen (ADVIL,MOTRIN) 200 MG tablet, Take 400 mg by mouth every 6 (six) hours as needed for pain., Disp: , Rfl:   From the [place] records of 08/30/2022: Lipitor, aspirin, Arimidex, propranolol, vitamin D, loratadine.  LKW: Facial symptoms noticed around 4 PM today but has been having some right facial symptoms and feeling of facial fullness as well as lightheadedness for the past few days tpa given?: No, likely Bell's palsy, also outside the window IR Thrombectomy? No, symptoms not consistent with LVO Modified Rankin Scale: 1-No significant post stroke disability and can perform usual duties with stroke symptoms-due to pain and arthritis Time of teleneurologist evaluation:-1802 hrs.  Exam: Vitals:   09/20/22 1742 09/20/22 1828  BP: (!) 147/99 (!) 159/78  Pulse: 83 70  Resp: 15 (!) 26  Temp: 98.8 F (37.1 C)   SpO2: 100% 98%    General: Awake alert in no distress Neurological exam Awake alert oriented x 3, no dysarthria, no aphasia. Cranial nerve examination: Pupils equal round react light, extraocular moods intact, visual fields full, face appears mildly asymmetric with angle of the mouth on the right appearing slightly lower than the left but she is able to smile symmetrically.  Facial sensation on the right is somewhat abnormal in comparison to the left.  Exhibits Bell's phenomenon lagophthalmos.  On wrinkling her forehead, the forehead wrinkles on both sides with very subtle asymmetry with less wrinkling on the right.  Tongue and palate midline.  Motor examination with no deficits.  Sensory examination elsewhere with no deficits.  Coordination with no dysmetria   NIHSS-2 (1 for facial and 1 for sensory)   Imaging Reviewed: CT head: Aspects 10.  No bleed  Labs reviewed in epic and pertinent values follow: CBC    Component Value Date/Time   WBC 6.6 09/20/2022 1818   RBC 4.95  09/20/2022 1818   HGB 13.5 09/20/2022 1818   HCT 41.3 09/20/2022 1818   PLT 359 09/20/2022 1818   MCV 83.4 09/20/2022 1818   MCH 27.3 09/20/2022 1818   MCHC 32.7 09/20/2022 1818   RDW 13.6 09/20/2022 1818   LYMPHSABS 1.8 09/20/2022 1818   MONOABS 0.4 09/20/2022 1818   EOSABS 0.2 09/20/2022 1818   BASOSABS 0.1 09/20/2022 1818   CMP     Component Value Date/Time   NA 140 07/03/2021 2007   K 3.8 07/03/2021 2007   CL 104 07/03/2021 2007   CO2 26 07/03/2021 2007   GLUCOSE 108 (H) 07/03/2021 2007   BUN 15 07/03/2021 2007   CREATININE 0.59 07/03/2021 2007   CALCIUM 9.4 07/03/2021 2007   PROT 7.3 07/08/2013 0120   ALBUMIN 4.0 07/08/2013 0120   AST 15 07/08/2013 0120   ALT 12 07/08/2013 0120   ALKPHOS 66 07/08/2013 0120   BILITOT 0.2 (L) 07/08/2013 0120   GFRNONAA >60 07/03/2021 2007   GFRAA >90 07/08/2013 0120     Assessment: 52 year old with above past medical history presenting for evaluation of right facial numbness and weakness that was noticeable today at 4 PM but has been having some symptoms related to right ear, right face and change of taste for the past 4 days.  On examination, she has full facial involvement, right lid lagophthalmos/Bell's phenomenon making me more concern for this being a lower motor neuron facial paresis (Bell's palsy) versus low concern for stroke but given her risk factors, stroke needs to be ruled out especially lacunar brainstem infarction.  Impression: Likely Bell's palsy -House-Brackman grade 2 Rule out brainstem stroke given risk factors  Recommendations:  Mild symptoms and more concern for Bell's palsy rather than stroke precludes use of IV thrombolysis. Transfer ER to ER to [facility] for stat MRI brain without contrast. If MRI brain is positive, she will need admission to the hospitalist for full stroke workup. If MRI brain is negative, treat as Bell's palsy with prednisone 50 mg daily (no taper) AND valacyclovir 1000 mg 3 times a  day for total treatment duration of 1 week.  Plan was discussed with the ED provider, Dr. Tomi Bamberger over the phone. He will arrange for transfer ER to ER.   **If the imaging is positive for stroke at [facility], please make sure to inform the on-call neurologist of the patient.  She will be followed by the stroke team and follow-up tomorrow in that case.  -- Amie Portland, MD Neurologist [facility] Pager: [phone]

## 2022-09-20 NOTE — ED Provider Notes (Signed)
Transferred here from [facility] for MRI.  In short this is a 52 year old female with a past medical history of hypertension and TIA that presented to the emergency department with right-sided facial weakness and tingling with slurred speech that started this morning.  Physical Exam  BP 133/72   Pulse 71   Temp 97.8 F (36.6 C) (Oral)   Resp 18   LMP 07/08/2013   SpO2 95%   Physical Exam Vitals and nursing note reviewed.  Constitutional:      General: She is not in acute distress.    Appearance: Normal appearance.  HENT:     Head: Normocephalic and atraumatic.     Nose: Nose normal.     Mouth/Throat:     Mouth: Mucous membranes are moist.     Pharynx: Oropharynx is clear.  Eyes:     Extraocular Movements: Extraocular movements intact.     Conjunctiva/sclera: Conjunctivae normal.     Pupils: Pupils are equal, round, and reactive to light.  Cardiovascular:     Rate and Rhythm: Normal rate and regular rhythm.     Heart sounds: Normal heart sounds.  Pulmonary:     Effort: Pulmonary effort is normal.     Breath sounds: Normal breath sounds.  Abdominal:     General: Abdomen is flat.  Musculoskeletal:        General: Normal range of motion.     Cervical back: Normal range of motion and neck supple.  Skin:    General: Skin is warm and dry.  Neurological:     Mental Status: She is alert and oriented to person, place, and time.     Comments: R-sided facial droop with R eye lid lag Sensation intact in face and bilateral upper and lower extremities No drift in all 4 extremities  Normal finger to nose bilaterally 5/5 strength in bilateral UE and LE     Procedures  Procedures  ED Course / MDM   Clinical Course as of 09/20/22 2346  Tue Sep 20, 2022  1812 Head CT normal.  Case discussed with Dr. Maree Erie [JK]  [phone] Initial labs reviewed.  CBC unremarkable.  Metabolic panel is currently pending. [QZ]  3007 Head CT does not show any acute abnormalities [JK]  2339 MRI brain  is negative. Patient will be treated for Bell's palsy and given outpatient follow up. [VK]    Clinical Course User Index [JK] Dorie Rank, MD [VK] Kemper Durie, DO   Medical Decision Making Patient was initially evaluated at [facility] and transferred here by Dr. Tomi Bamberger for MRI.  Initial CT imaging there showed no obvious stroke.  Concern for CVA versus Bell's palsy.  Patient has had no change in her neurologic status since transfer here.  MRI has been ordered and she will be closely reassessed.  Amount and/or Complexity of Data Reviewed Labs: ordered. Radiology: ordered.  Risk OTC drugs. Prescription drug management.         Leanord Asal K, DO 09/20/22 2346

## 2022-09-20 NOTE — Progress Notes (Signed)
NEW PATIENT Date of Service/Encounter:  09/22/22 Referring provider: none-self referred Primary care provider: Girtha Hake, MD  Subjective:  Tamara Ferguson is a 52 y.o. female presenting today for evaluation of ronic rhinitis. History obtained from: chart review and patient.   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 She has not taken any antibiotics. She does have a lot of pain in her face, she has pain in her ears. Has had a weird taste in her mouth. No fevers. She was recently diagnosed with Bell's palsy. She is taking a steroid and an antiviral.  Steroid helping some with the pressure. Occurs year-round with spring and fall flares Potential triggers: dust-uses mask and goggles at work around dust from [employer] in [place]. Treatments tried: sinus rinses, claritin daily Previous allergy testing: no History of reflux/heartburn:  yes- taking one pill every other day Previous sinus, ear, tonsil, adenoid surgeries: rhinoplasty, after busting her nose, septoplasty when in [employer] that got worse. She did have some relief with her surgeries. She continues to follow with Plastics and they will occasionally inject steroids in her nose for inflammation.  She has had generalized swelling from a bee sting. She was very young.  No stings since. She does not carry an epipen.   She does have psoriasis.  Other allergy screening: Asthma: no Food allergy: no Medication allergy:  septra - swelling, penicillin- itching. Last had penicillin in the last 5 years. She has taken amoxicillin 2 weeks ago and did fine. Urticaria: no Eczema:no History of recurrent infections suggestive of immunodeficency: no Vaccinations are up to date.   Past Medical History: History  reviewed. No pertinent past medical history. Medication List:  Current Outpatient Medications  Medication Sig Dispense Refill   anastrozole (ARIMIDEX) 1 MG tablet Take by mouth.     atorvastatin (LIPITOR) 40 MG tablet Take 40 mg by mouth daily.     doxycycline (VIBRAMYCIN) 100 MG capsule Take 1 capsule (100 mg total) by mouth 2 (two) times daily for 10 days. 20 capsule 0   EPINEPHrine (EPIPEN 2-PAK) 0.3 mg/0.3 mL IJ SOAJ injection Inject 0.3 mg into the muscle as needed for anaphylaxis. 2 each 1   OTEZLA 30 MG TABS Take 1 tablet by mouth 2 (two) times daily.     pantoprazole (PROTONIX) 40 MG tablet Take 40 mg by mouth daily.     propranolol (INDERAL) 20 MG tablet Take 20 mg by mouth 3 (three) times daily.     valACYclovir (VALTREX) 1000 MG tablet Take 1,000 mg by mouth 3 (three) times daily.     No current facility-administered medications for this visit.   Known Allergies:  Allergies  Allergen Reactions   Penicillins Itching   Septra [Sulfamethoxazole-Trimethoprim] Itching and Swelling   Past Surgical History: Past Surgical History:  Procedure Laterality Date   MASTECTOMY     Family History: History reviewed. No pertinent family history. Social History: Mithra lives in a house 45 years ago, water damaged now repaired, carpet in home but not bedroom, home under renovations for the past year. Gas heating, central AC, 3 indoor dogs, occasional cockroaches in the home.  Not using dust mite protection on the bedding or pillows.  No smoke exposure.  Previous smoker quit in 2003 0.55 PPD x 15 years.  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  No HEPA filter in the home.  [place] interstate/industrial [place]  ROS:  All other systems negative except as noted per HPI.  Objective:  Blood pressure 128/88, pulse 78, temperature 98.2 F (36.8 C), temperature source Temporal, resp. rate 18, height 5' 11.25" (1.81 m), weight 260 lb 11.2 oz (118.3 kg), SpO2 96  %. Body mass index is 36.11 kg/m. Physical Exam:  General Appearance:  Alert, cooperative, no distress, appears stated age  Head:  Normocephalic, without obvious abnormality, atraumatic  Eyes:  Conjunctiva clear, EOM's intact  Nose: Nares normal, hypertrophic turbinates, normal mucosa, no visible anterior polyps, and septum midline  Throat: Lips, tongue normal; teeth and gums normal, normal posterior oropharynx  Neck: Supple, symmetrical  Lungs:   clear to auscultation bilaterally, Respirations unlabored, no coughing  Heart:  regular rate and rhythm and no murmur, Appears well perfused  Extremities: No edema  Skin: Skin color, texture, turgor normal, no rashes or lesions on visualized portions of skin  Neurologic: No gross deficits     Diagnostics: Skin Testing: Deferred due to recent antihistamines use. Labs: see below  Assessment and Plan  Chronic Rhinitis: allergic vs nonallergic; with acute sinusitis: - allergy testing today: deferred due to recent antihistamine - doxycycline 100 mg twice daily for 10 days-take with probiotics  - Prevention:  - return on January 15th - return 1:30 PM  - Symptom control: Nasal saline rinses or spray twice daily and as needed. Use prior to medicated nasal sprays - Start Nasal Steroid Spray: Best results if used daily. - Options include Flonase (fluticasone), Nasocort (triamcinolone), Nasonex (mometasome) 1- 2 sprays in each nostril daily.  - All can be purchased over-the-counter if not covered by insurance. - Start Antihistamine: daily or daily as needed.   -Options include Zyrtec (Cetirizine) '10mg'$ , Claritin (Loratadine) '10mg'$ , Allegra (Fexofenadine) '180mg'$ , or Xyzal (Levocetirinze) '5mg'$  - Can be purchased over-the-counter if not covered by insurance.  Chronic Conjunctivitis:  - Start Allergy Eye drops-great options include Pataday (Olopatadine) or Zaditor (ketotifen) for eye symptoms daily as needed-both sold over the counter if not covered  by insurance.  -Avoid eye drops that say red eye relief as they may contain medications that dry out your eyes.  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 Do not take claritin 3 days prior to this appointment. It was a pleasure meeting you in clinic today! Thank you for allowing me to participate in your care.   I appreciate the opportunity to take part in [place] care. Please do not hesitate to contact me  with questions.  Sincerely,  Sigurd Sos, MD [facility]

## 2022-09-20 NOTE — Consult Note (Signed)
Telestroke cart was activated at [facility]. Per treatment team, pt's LKW was at 1600 with c/o R facial numbness/droop. EDP assessed pt prior to cart activation. Pt transported to CT at 1753. [facility] was paged for code stroke at 1751 and 1800. Dr, Rory Percy, [facility] appeared on telestroke cart at 1804 to assess the patient in CT. Based on [facility]'s assessment, pt does not meet criteria for emergent interventions at this time 2/2 likely Bell's palsy, also outside the window. [facility] to f/u with EDP regarding recommendations. No further needs from Telestroke RN at this time. Telestroke cart disconnected at [facility].

## 2022-09-20 NOTE — ED Notes (Signed)
Patient transported to MRI 

## 2022-09-22 ENCOUNTER — Ambulatory Visit: Payer: 59 | Admitting: Internal Medicine

## 2022-09-22 ENCOUNTER — Encounter: Payer: Self-pay | Admitting: Internal Medicine

## 2022-09-22 VITALS — BP 128/88 | HR 78 | Temp 98.2°F | Resp 18 | Ht 71.25 in | Wt 260.7 lb

## 2022-09-22 DIAGNOSIS — K219 Gastro-esophageal reflux disease without esophagitis: Secondary | ICD-10-CM | POA: Insufficient documentation

## 2022-09-22 DIAGNOSIS — T6391XA Toxic effect of contact with unspecified venomous animal, accidental (unintentional), initial encounter: Secondary | ICD-10-CM

## 2022-09-22 DIAGNOSIS — Z880 Allergy status to penicillin: Secondary | ICD-10-CM

## 2022-09-22 DIAGNOSIS — J019 Acute sinusitis, unspecified: Secondary | ICD-10-CM

## 2022-09-22 DIAGNOSIS — H1013 Acute atopic conjunctivitis, bilateral: Secondary | ICD-10-CM

## 2022-09-22 DIAGNOSIS — J310 Chronic rhinitis: Secondary | ICD-10-CM

## 2022-09-22 MED ORDER — DOXYCYCLINE HYCLATE 100 MG PO CAPS: 100.0000 mg | ORAL_CAPSULE | Freq: Two times a day (BID) | ORAL | 0 refills | Status: AC

## 2022-09-22 MED ORDER — EPINEPHRINE 0.3 MG/0.3ML IJ SOAJ: 0.3000 mg | INTRAMUSCULAR | 1 refills | Status: AC | PRN

## 2022-09-22 NOTE — Patient Instructions (Addendum)
Chronic Rhinitis: allergic vs nonallergic; with acute sinusitis: - allergy testing today: deferred due to recent antihistamine - doxycycline 100 mg twice daily for 10 days-take with probiotics  - Prevention:  - return on January 15th - return 1:30 PM  - Symptom control: Nasal saline rinses or spray twice daily and as needed. Use prior to medicated nasal sprays - Start Nasal Steroid Spray: Best results if used daily. - Options include Flonase (fluticasone), Nasocort (triamcinolone), Nasonex (mometasome) 1- 2 sprays in each nostril daily.  - All can be purchased over-the-counter if not covered by insurance. - Start Antihistamine: daily or daily as needed.   -Options include Zyrtec (Cetirizine) 10mg , Claritin (Loratadine) 10mg , Allegra (Fexofenadine) 180mg , or Xyzal (Levocetirinze) 5mg  - Can be purchased over-the-counter if not covered by insurance.  Chronic  Conjunctivitis:  - Start Allergy Eye drops-great options include Pataday (Olopatadine) or Zaditor (ketotifen) for eye symptoms daily as needed-both sold over the counter if not covered by insurance.  -Avoid eye drops that say red eye relief as they may contain medications that dry out your eyes.  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 Do not take claritin 3 days prior to this appointment. It was a pleasure meeting you in clinic today! Thank you for allowing me to participate in your care.  Sigurd Sos, MD [facility]  Gastroesophageal Reflux Induced Respiratory Disease and Laryngopharyngeal Reflux (LPR): Gastroesophageal reflux disease (GERD) is a condition where the contents of the stomach reflux or back up into the esophagus or swallowing tube.  This can result in a variety of clinical symptoms including classic symptoms and atypical symptoms.  Classic symptoms of GERD include: heartburn, chest pain, acid taste in the mouth, and difficulty in swallowing.  Atypical symptoms of GERD include laryngopharyngeal reflux (LPR) and asthma.  LPR occurs when stomach reflux comes all the way up to the throat.  Clinical symptoms include hoarseness, raspy voice, laryngitis, throat clearing, postnasal drip, mucus stuck in the throat, a sensation of a lump in the throat, sore throat, and cough.  Most patients with LPR do not have classic symptoms of GERD.  Asthma can also be triggered by GERD.  The acid stomach fluid can stimulate nerve fibers in the esophagus which can cause an increase in bronchial muscle tone and narrowing of the airways.  Acid stomach contents may also reflux into the trachea and bronchi of the lungs where it can trigger an asthma attack.  Many people with GERD triggered asthma do not have classic symptoms of GERD.  Diagnosis of LPR and GERD induced  asthma is frequently made from a typical history and response to medications.  It may take several months of medications to see a good response.  Occasionally, a 24-hour esophageal pH probe study must be performed.  Treatment of GERD/LPR  includes:   Modification of diet and lifestyle Stop smoking Avoid overeating and lose weight Avoid acidic and fatty foods, chocolate, onions, garlic, peppermint Elevate the head of your bed 6 to 8 inches with blocks or wedge Medications Zantac, Pepcid, Axid, Tagamet Prilosec, Prevacid, Aciphex, Protonix, Nexium Surgery

## 2022-09-23 ENCOUNTER — Encounter (HOSPITAL_BASED_OUTPATIENT_CLINIC_OR_DEPARTMENT_OTHER): Payer: Self-pay | Admitting: Emergency Medicine

## 2022-09-24 NOTE — Progress Notes (Unsigned)
Date of Service/Encounter:  09/26/22  Allergy testing appointment   Initial visit on 09/22/22, seen for chronic rhinitis and conjunctivitis, concern for venom allergy, history of penicillin allergy, acute sinusitis.  Please see that note for additional details.  Patient is concerned with some food reactions today.  She reports that she has difficulty swallowing and has an attack of hiccups when she eats bread.  It seems to get stuck in her throat and she develops more hiccups which is the predominant symptom. She also has some concerns with milk and mucous production.   Today reports for allergy diagnostic testing:    DIAGNOSTICS:   Skin Testing: Environmental allergy panel.   Adequate positive and negative controls Results discussed with patient/family.   Airborne Adult Perc - 09/26/22 1423     Time Antigen Placed 0210    Allergen Manufacturer Lavella Hammock    Location Back    Number of Test 58    Panel 1 Select    1. Control-Buffer 50% Glycerol 2+    2. Control-Histamine 1 mg/ml Negative    3. Albumin saline Negative    4. [place] Negative    5. [place] Negative    6. Johnson Negative    7. [place] Blue Negative    9. Perennial Rye Negative    10. Sweet Vernal Negative    11. Timothy Negative    12. Cocklebur Negative    13. Burweed Marshelder Negative    14. Ragweed, short Negative    15. Ragweed, Giant Negative    16. Plantain,  English Negative    17. Lamb's Quarters Negative    18. Sheep Sorrell Negative    19. Rough Pigweed Negative    20. Marsh Elder, Rough Negative    21. Mugwort, Common Negative    22. Ash mix Negative    23. Birch mix Negative    24. Beech American Negative    25. Box, Elder Negative    26. Cedar, red Negative    27. Cottonwood, [place] Negative    28. Elm mix Negative    29. Hickory Negative    30. Maple mix Negative    31. Oak, [place] mix Negative    32. Pecan Pollen Negative    33. Pine mix Negative    34. Sycamore Eastern Negative     35. [place], Black Pollen Negative    36. Alternaria alternata Negative    37. Cladosporium Herbarum Negative    38. Aspergillus mix Negative    39. Penicillium mix Negative    40. Bipolaris sorokiniana (Helminthosporium) Negative    41. Drechslera spicifera (Curvularia) Negative    42. Mucor plumbeus Negative    43. Fusarium moniliforme Negative    44. Aureobasidium pullulans (pullulara) Negative    45. Rhizopus oryzae Negative    46. Botrytis cinera Negative    47. Epicoccum nigrum Negative    48. Phoma betae Negative    49. Candida Albicans Negative    50. Trichophyton mentagrophytes Negative    51. Mite, D Farinae  5,000 AU/ml Negative    52. Mite, D Pteronyssinus  5,000 AU/ml Negative    53. Cat Hair 10,000 BAU/ml Negative    54.  Dog Epithelia Negative    55. Mixed Feathers Negative    56. Horse Epithelia Negative    57. Cockroach, German Negative    58. Mouse Negative    59. Tobacco Leaf Negative             Intradermal - 09/26/22  1514     Time Antigen Placed 1500    Allergen Manufacturer Greer    Location Arm    Number of Test 15    Control Negative    Guatem[place] Negative    Johnson Negative    7 Grass Negative    Ragweed mix Negative    Weed mix Negative    Tree mix Negative    Mold 1 Negative    Mold 2 Negative    Mold 3 Negative    Mold 4 Negative    Cat 2+    Dog 3+    Cockroach Negative    Mite mix Negative             Food Adult Perc - 09/26/22 1400     Time Antigen Placed 0210    Allergen Manufacturer Greer    Location Back    Number of allergen test 14    1. Peanut Negative    2. Soybean Negative    3. Wheat Negative    4. Sesame Negative    5. Milk, cow Negative    6. Egg White, Chicken Negative    7. Casein Negative    8. Shellfish Mix Negative    9. Fish Mix Negative    10. Cashew Negative    30. Barley Negative    31. Oat  Negative    32. Rye  Negative    34. Rice Negative             Allergy testing results were  read and interpreted by myself, documented by clinical staff.  Assessment:  Perennial allergic rhinitis (cats, dogs)  Dysphagia - with concern for food allergies (especially to bread) - negative testing  Plan:  1. Chronic rhinitis - Testing today showed: cat and dog. - Copy of test results provided.  - Avoidance measures provided. - Stop taking:  - Continue with: Claritin (loratadine) '10mg'$  tablet once daily - Start taking: Flonase (fluticasone) one spray per nostril EVERY DAY (AIM FOR EAR ON EACH SIDE) and Atrovent (ipratropium) 0.03% one spray per nostril 2-3 times daily as needed (CAN BE OVER DRYING) - You can use an extra dose of the antihistamine, if needed, for breakthrough symptoms.  - Consider nasal saline rinses 1-2 times daily to remove allergens from the nasal cavities as well as help with mucous clearance (this is especially helpful to do before the nasal sprays are given)  2. Dysphagia - Testing was negative to the most common foods as well as the starches. - There is a the low positive predictive value of food allergy testing and hence the high possibility of false positives. - In contrast, food allergy testing has a high negative predictive value, therefore if testing is negative we can be relatively assured that they are indeed negative.  - Copy of testing results provided.  3. Return in about 6 months (around 03/27/2023).     Salvatore Marvel, MD [facility] [place]

## 2022-09-26 ENCOUNTER — Ambulatory Visit: Payer: 59 | Admitting: Allergy & Immunology

## 2022-09-26 ENCOUNTER — Encounter: Payer: Self-pay | Admitting: Allergy & Immunology

## 2022-09-26 VITALS — BP 130/84 | HR 60 | Temp 98.3°F | Resp 18

## 2022-09-26 DIAGNOSIS — J310 Chronic rhinitis: Secondary | ICD-10-CM

## 2022-09-26 DIAGNOSIS — R1310 Dysphagia, unspecified: Secondary | ICD-10-CM

## 2022-09-26 DIAGNOSIS — J3089 Other allergic rhinitis: Secondary | ICD-10-CM | POA: Insufficient documentation

## 2022-09-26 MED ORDER — FLUTICASONE PROPIONATE 50 MCG/ACT NA SUSP: 2.0000 | Freq: Every day | NASAL | 5 refills | Status: AC

## 2022-09-26 MED ORDER — IPRATROPIUM BROMIDE 0.03 % NA SOLN: 2.0000 | Freq: Three times a day (TID) | NASAL | 5 refills | Status: AC

## 2022-09-26 NOTE — Patient Instructions (Addendum)
1. Chronic rhinitis - Testing today showed: cat and dog. - Copy of test results provided.  - Avoidance measures provided. - Stop taking:  - Continue with: Claritin (loratadine) '10mg'$  tablet once daily - Start taking: Flonase (fluticasone) one spray per nostril EVERY DAY (AIM FOR EAR ON EACH SIDE) and Atrovent (ipratropium) 0.03% one spray per nostril 2-3 times daily as needed (CAN BE OVER DRYING) - You can use an extra dose of the antihistamine, if needed, for breakthrough symptoms.  - Consider nasal saline rinses 1-2 times daily to remove allergens from the nasal cavities as well as help with mucous clearance (this is especially helpful to do before the nasal sprays are given)  2. Dysphagia - Testing was negative to the most common foods as well as the starches. - There is a the low positive predictive value of food allergy testing and hence the high possibility of false positives. - In contrast, food allergy testing has a high negative predictive value, therefore if testing is negative we can be relatively assured that they are indeed negative.  - Copy of testing results provided.  3. Return in about 6 months (around 03/27/2023).    Please inform us of any Emergency Department visits, hospitalizations, or changes in symptoms. Call us before going to the ED for breathing or allergy symptoms since we might be able to fit you in for a sick visit. Feel free to contact us anytime with any questions, problems, or concerns.  It was a pleasure to meet you and your niece today!  Websites that have reliable patient information: 1. American Academy of Asthma, Allergy, and Immunology: www.aaaai.org 2. Food Allergy Research and Education (FARE): foodallergy.org 3. Mothers of Asthmatics: http://www.asthmacommunitynetwork.org 4. American College of Allergy, Asthma, and Immunology: www.acaai.org   COVID-19 Vaccine Information can be found at:  [email or web address] For questions related to vaccine distribution or appointments, please email [email or web address] or call [phone].   We realize that you might be concerned about having an allergic reaction to the COVID19 vaccines. To help with that concern, WE ARE OFFERING THE COVID19 VACCINES IN OUR OFFICE! Ask the front desk for dates!     "Like" [place] on Facebook and Instagram for our latest updates!      A healthy democracy works best when [employer] participate! Make sure you are registered to vote! If you have moved or changed any of your contact information, you will need to get this updated before voting!  In some cases, you MAY be able to register to vote online: [email or web address]       Airborne Adult Perc - 09/26/22 1423     Time Antigen Placed 0210    Allergen Manufacturer Lavella Hammock    Location Back    Number of Test 58    Panel 1 Select    1. Control-Buffer 50% Glycerol 2+    2. Control-Histamine 1 mg/ml Negative    3. Albumin saline Negative    4. [place]e [place] Negative    5. [place] Negative    6. Johnson Negative    7. Winston Blue Negative    9. Perennial Rye Negative    10. Sweet Vernal Negative    11. Timothy Negative    12. Cocklebur Negative    13. Burweed Marshelder Negative    14. Ragweed, short Negative    15. Ragweed, Giant Negative    16. Plantain,  English Negative    17. Lamb's Quarters Negative    18. Sheep Sorrell Negative    19. Rough  Pigweed Negative    20. Marsh Elder, Rough Negative    21. Mugwort, Common Negative    22. Ash mix Negative    23. Birch mix Negative    24. Beech American Negative    25. Box, Elder Negative    26. Cedar, red Negative    27. Cottonwood, [place] Negative    28. Elm mix Negative    29. Hickory Negative    30. Maple mix Negative    31. Oak, [place] mix Negative    32. Pecan Pollen Negative    33. Pine mix Negative     34. Sycamore Eastern Negative    35. [place], Black Pollen Negative    36. Alternaria alternata Negative    37. Cladosporium Herbarum Negative    38. Aspergillus mix Negative    39. Penicillium mix Negative    40. Bipolaris sorokiniana (Helminthosporium) Negative    41. Drechslera spicifera (Curvularia) Negative    42. Mucor plumbeus Negative    43. Fusarium moniliforme Negative    44. Aureobasidium pullulans (pullulara) Negative    45. Rhizopus oryzae Negative    46. Botrytis cinera Negative    47. Epicoccum nigrum Negative    48. Phoma betae Negative    49. Candida Albicans Negative    50. Trichophyton mentagrophytes Negative    51. Mite, D Farinae  5,000 AU/ml Negative    52. Mite, D Pteronyssinus  5,000 AU/ml Negative    53. Cat Hair 10,000 BAU/ml Negative    54.  Dog Epithelia Negative    55. Mixed Feathers Negative    56. Horse Epithelia Negative    57. Cockroach, German Negative    58. Mouse Negative    59. Tobacco Leaf Negative             Intradermal - 09/26/22 1514     Time Antigen Placed 1500    Allergen Manufacturer Lavella Hammock    Location Arm    Number of Test 15    Control Negative    Guatem[place] Negative    Johnson Negative    7 Grass Negative    Ragweed mix Negative    Weed mix Negative    Tree mix Negative    Mold 1 Negative    Mold 2 Negative    Mold 3 Negative    Mold 4 Negative    Cat 2+    Dog 3+    Cockroach Negative    Mite mix Negative             Food Adult Perc - 09/26/22 1400     Time Antigen Placed 0210    Allergen Manufacturer Greer    Location Back    Number of allergen test 14    1. Peanut Negative    2. Soybean Negative    3. Wheat Negative    4. Sesame Negative    5. Milk, cow Negative    6. Egg White, Chicken Negative    7. Casein Negative    8. Shellfish Mix Negative    9. Fish Mix Negative    10. Cashew Negative    30. Barley Negative    31. Oat  Negative    32. Rye  Negative    34. Rice Negative

## 2022-09-27 LAB — ALLERGEN HYMENOPTERA PANEL
Bumblebee: <0.1 kU/L
Honeybee IgE: <0.1 kU/L
Hornet, White Face, IgE: <0.1 kU/L
Hornet, Yellow, IgE: <0.1 kU/L
Paper Wasp IgE: 0.18 kU/L — AB
Yellow Jacket, IgE: 0.67 kU/L — AB

## 2022-09-27 LAB — TRYPTASE: Tryptase: 5.1 ug/L (ref 2.2–13.2)

## 2023-02-16 ENCOUNTER — Telehealth: Payer: Self-pay | Admitting: Allergy & Immunology

## 2023-02-16 MED ORDER — TRIAMCINOLONE ACETONIDE 0.5 % EX OINT: 1.0000 | TOPICAL_OINTMENT | Freq: Two times a day (BID) | CUTANEOUS | 2 refills | Status: AC

## 2023-02-16 NOTE — Telephone Encounter (Signed)
Tamara Ferguson called in and states she called Dr. Marlynn Perking Sunday night while she was on call and explained she was stung by several bees.  She states she had an allergic reaction to them and her legs swelled.  Tamara Ferguson states Dr. Marlynn Perking was supposed to call in a cream for her and never did.  Tamara Ferguson would like this cream because she states her leg is not getting better.  [facility] uses [facility] [address].

## 2023-02-16 NOTE — Telephone Encounter (Signed)
Patient called back in - DOB/Pharmacy verified - stated cream/steroid was never sent in regarding notation below.  Patient advised message would be forwarded to provider for next step.  Patient verbalized understanding - is very upset - no further questions.  Ph[facility]: [facility] [address].

## 2023-02-16 NOTE — Telephone Encounter (Addendum)
Called patient - DOB verified - advised of provider notation below.  Patient stated she was stung: upper/lower legs and back - will take pictures of legs and send via email to [email or web address] to be forwarded to Dr. Dellis Anes for review.  Patient stated he can mover her legs and walk - painful with the swelling.  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

## 2023-02-16 NOTE — Telephone Encounter (Signed)
I am sending in triamcinolone. But is she able to move her leg? Can she walk? Does she need to be seen in person?   Malachi Bonds, MD [facility]

## 2023-02-16 NOTE — Telephone Encounter (Signed)
I look forward to seeing the pics.

## 2023-09-09 IMAGING — DX DG CHEST 2V
2 series · 2 of 2 positions shown · non-contrast
Comparison: 09/04/2006

CLINICAL DATA: Shortness of breath.

EXAM:
CHEST - 2 VIEW

[chest pa]
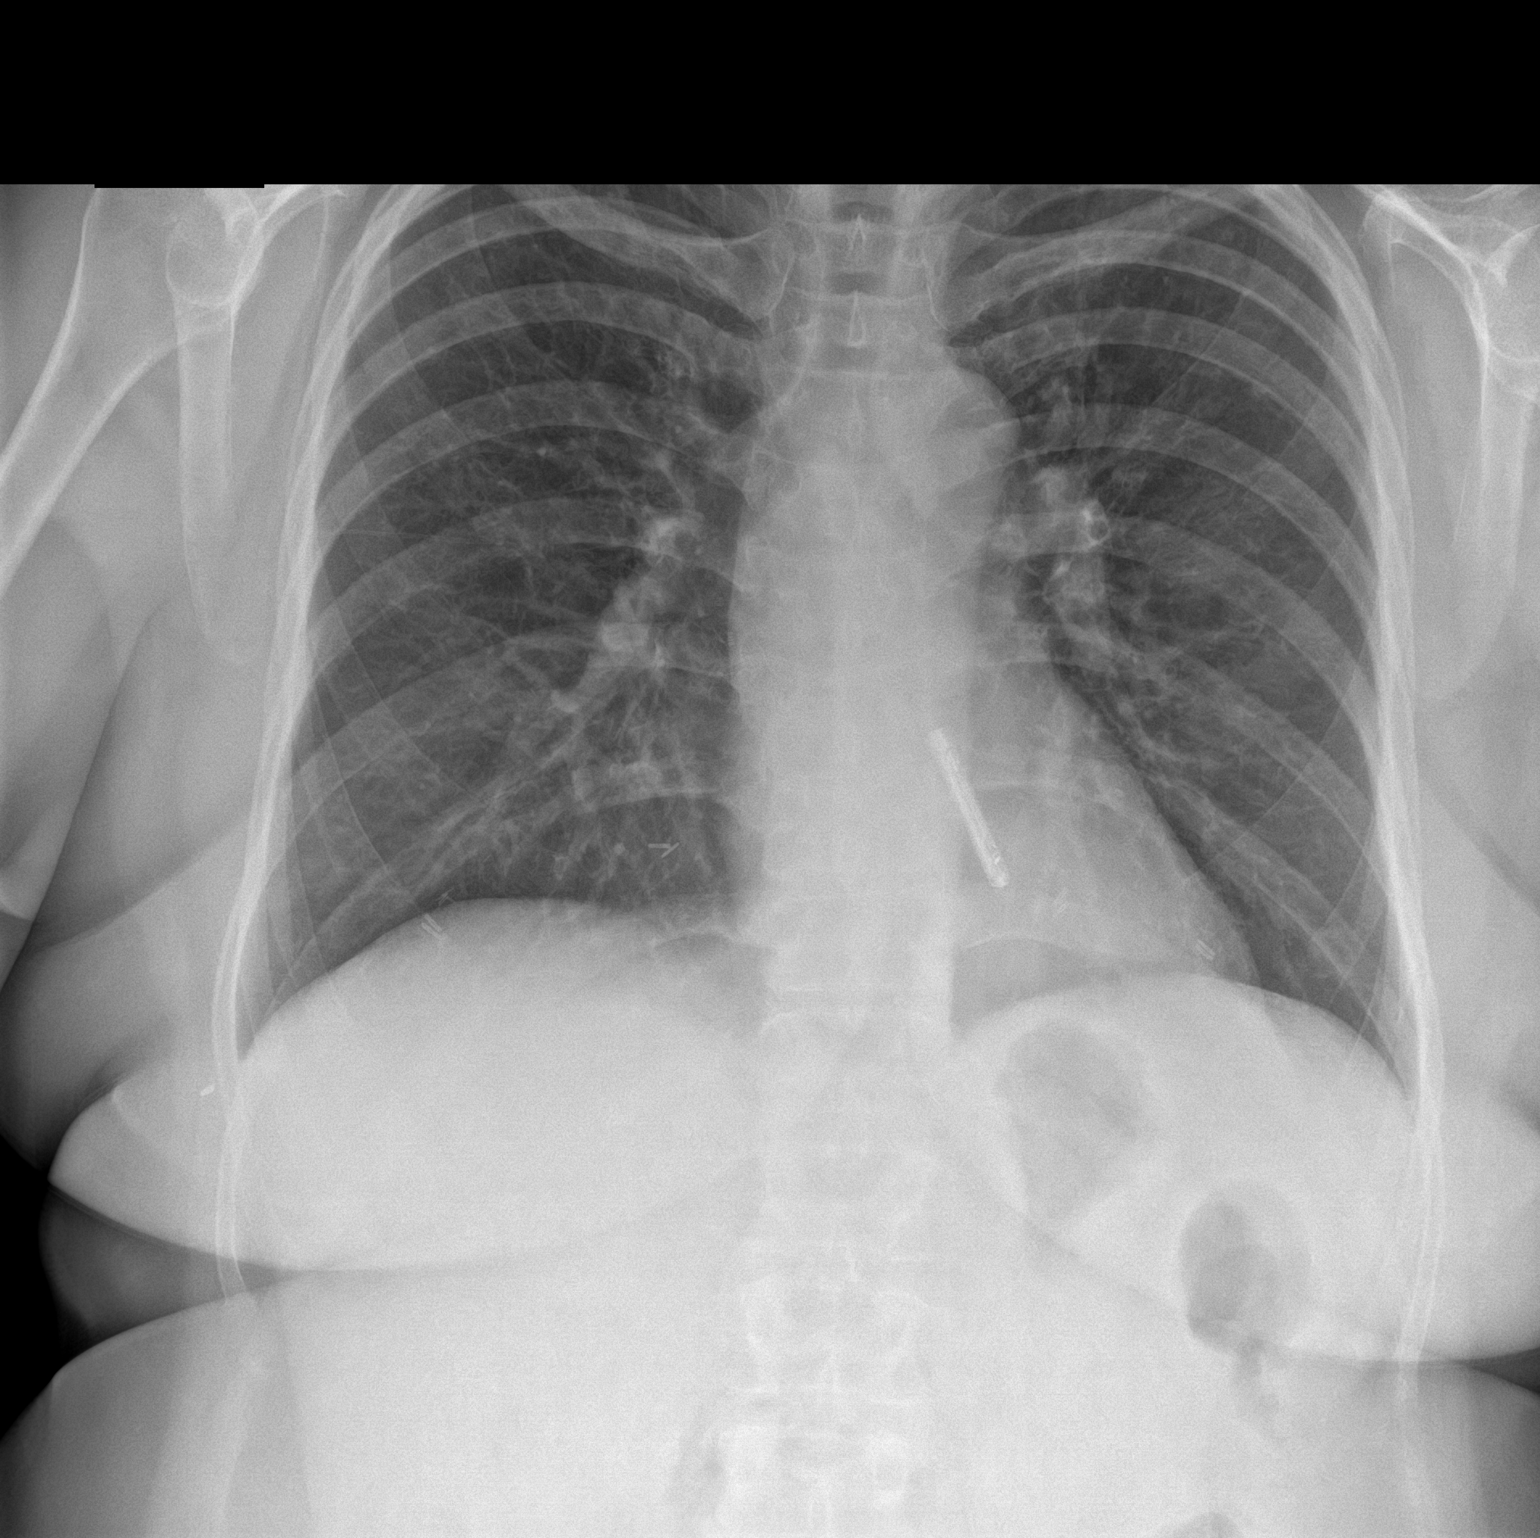

[chest lat]
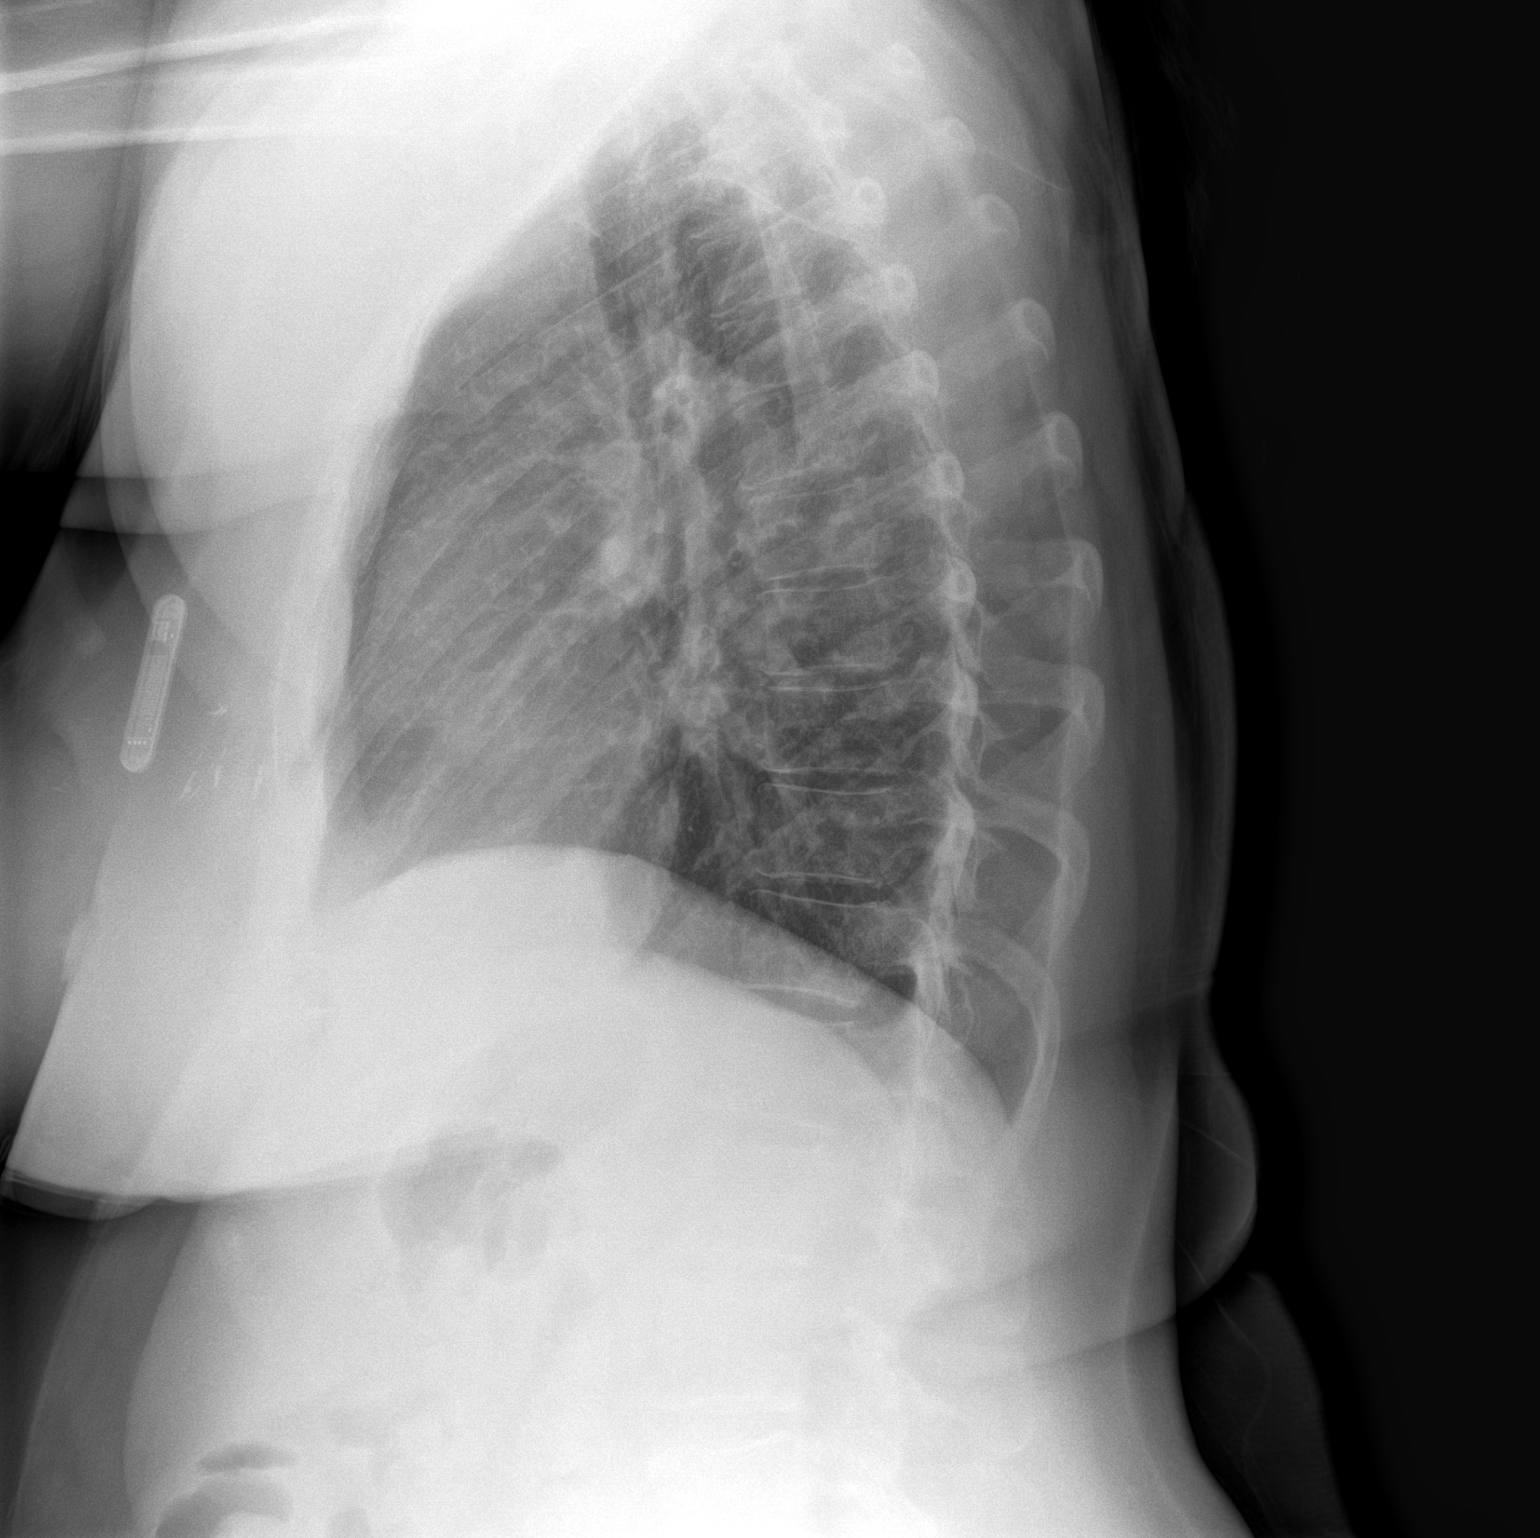

[2 of 2 positions shown; findings below may reference images not displayed]

FINDINGS: Heart size is normal. Lungs are free of focal consolidations and
pleural effusions. Loop recorder overlies the anterior mid chest.
IMPRESSION: No active cardiopulmonary disease.

## 2023-12-17 ENCOUNTER — Encounter (HOSPITAL_BASED_OUTPATIENT_CLINIC_OR_DEPARTMENT_OTHER): Payer: Self-pay | Admitting: Emergency Medicine

## 2023-12-17 ENCOUNTER — Other Ambulatory Visit: Payer: Self-pay

## 2023-12-17 ENCOUNTER — Emergency Department (HOSPITAL_BASED_OUTPATIENT_CLINIC_OR_DEPARTMENT_OTHER)
Admission: EM | Admit: 2023-12-17 | Discharge: 2023-12-17 | Disposition: A | Discharge status: Home / Self Care | Attending: Emergency Medicine | Admitting: Emergency Medicine

## 2023-12-17 ENCOUNTER — Emergency Department (HOSPITAL_BASED_OUTPATIENT_CLINIC_OR_DEPARTMENT_OTHER)

## 2023-12-17 DIAGNOSIS — I10 Essential (primary) hypertension: Secondary | ICD-10-CM | POA: Insufficient documentation

## 2023-12-17 DIAGNOSIS — S92534A Nondisplaced fracture of distal phalanx of right lesser toe(s), initial encounter for closed fracture: Secondary | ICD-10-CM | POA: Diagnosis not present

## 2023-12-17 DIAGNOSIS — W228XXA Striking against or struck by other objects, initial encounter: Secondary | ICD-10-CM | POA: Diagnosis not present

## 2023-12-17 DIAGNOSIS — Z853 Personal history of malignant neoplasm of breast: Secondary | ICD-10-CM | POA: Insufficient documentation

## 2023-12-17 DIAGNOSIS — Y9389 Activity, other specified: Secondary | ICD-10-CM | POA: Diagnosis not present

## 2023-12-17 DIAGNOSIS — S99921A Unspecified injury of right foot, initial encounter: Secondary | ICD-10-CM | POA: Diagnosis present

## 2023-12-17 DIAGNOSIS — Z79899 Other long term (current) drug therapy: Secondary | ICD-10-CM | POA: Diagnosis not present

## 2023-12-17 DIAGNOSIS — Z23 Encounter for immunization: Secondary | ICD-10-CM | POA: Diagnosis not present

## 2023-12-17 DIAGNOSIS — M7989 Other specified soft tissue disorders: Secondary | ICD-10-CM | POA: Diagnosis not present

## 2023-12-17 DIAGNOSIS — Y92007 Garden or yard of unspecified non-institutional (private) residence as the place of occurrence of the external cause: Secondary | ICD-10-CM | POA: Diagnosis not present

## 2023-12-17 MED ORDER — TETANUS-DIPHTH-ACELL PERTUSSIS 5-2.5-18.5 LF-MCG/0.5 IM SUSY
0.5000 mL | PREFILLED_SYRINGE | Freq: Once | INTRAMUSCULAR | Status: AC
  Administered 2023-12-17: 0.5 mL via INTRAMUSCULAR
  Filled 2023-12-17: qty 0.5

## 2023-12-17 NOTE — Discharge Instructions (Signed)
 There is evidence of a toe fracture of the little toe.  Tape daily to the fourth toe next to it.  Also keep the wound they are clean.  Without the tape you could soak the foot in warm water for 20 minutes a day and then tape.  Follow-up with your doctor.  This will take about 8 weeks to heal.  Would recommend applying some antibiotic ointment to the tip of the toe as well.

## 2023-12-17 NOTE — ED Provider Notes (Addendum)
 [place] EMERGENCY DEPARTMENT AT [facility] Provider Note   CSN: [id number] Arrival date & time: 12/17/23  2039     History  Chief Complaint  Patient presents with   Foot Injury    Tamara Ferguson is a 53 y.o. female.  Patient with injury to her right little toe while working in the garden.  Had some bleeding.  Patient did wash it off a little bit of water but did not soak the foot.  She can hit it on bricks.  Patient feels that her tetanus is out of date.  Past medical history is significant for hypertension abdominal hysterectomy mastectomy.  For breast cancer.  Patient is never used tobacco products.       Home Medications Prior to Admission medications   Medication Sig Start Date End Date Taking? Authorizing Provider  albuterol (VENTOLIN HFA) 108 (90 Base) MCG/ACT inhaler Inhale 1-2 puffs into the lungs every 6 (six) hours as needed for wheezing or shortness of breath. 07/03/21   Honor Loh M, PA-C  anastrozole (ARIMIDEX) 1 MG tablet Take by mouth. 09/02/22   [provider]  atorvastatin (LIPITOR) 40 MG tablet Take 40 mg by mouth daily. 06/30/22   [provider]  benzonatate (TESSALON) 100 MG capsule Take 1 capsule (100 mg total) by mouth every 8 (eight) hours. 07/03/21   Teressa Lower, PA-C  carboxymethylcellul-glycerin (LUBRICATING EYE DROPS) 0.5-0.9 % ophthalmic solution Place 1 drop into the right eye 4 (four) times daily as needed for dry eyes. 09/20/22   Elayne Snare K, DO  EPINEPHrine (EPIPEN 2-PAK) 0.3 mg/0.3 mL IJ SOAJ injection Inject 0.3 mg into the muscle as needed for anaphylaxis. 09/22/22   Verlee Monte, MD  fluticasone (FLONASE) 50 MCG/ACT nasal spray Place 2 sprays into both nostrils daily. 09/26/22   Alfonse Spruce, MD  guaiFENesin (MUCINEX) 600 MG 12 hr tablet Take 1 tablet (600 mg total) by mouth 2 (two) times daily. 09/20/22   Elayne Snare K, DO  ibuprofen (ADVIL,MOTRIN) 200 MG tablet Take 400 mg by mouth  every 6 (six) hours as needed for pain.    [provider]  ipratropium (ATROVENT) 0.03 % nasal spray Place 2 sprays into both nostrils 3 (three) times daily. 09/26/22   Alfonse Spruce, MD  OTEZLA 30 MG TABS Take 1 tablet by mouth 2 (two) times daily. 09/01/22   [provider]  pantoprazole (PROTONIX) 40 MG tablet Take 40 mg by mouth daily. 06/13/22   [provider]  propranolol (INDERAL) 20 MG tablet Take 20 mg by mouth 3 (three) times daily. 05/19/22   [provider]  triamcinolone ointment (KENALOG) 0.5 % Apply 1 Application topically 2 (two) times daily. 02/16/23   Alfonse Spruce, MD  valACYclovir (VALTREX) 1000 MG tablet Take 1,000 mg by mouth 3 (three) times daily. 09/20/22   [provider]      Allergies    Sulfa antibiotics, Coal tar extract, Darunavir, Penicillins, Septra [sulfamethoxazole-trimethoprim], and Penicillins    Review of Systems   Review of Systems  Skin:  Positive for wound.    Physical Exam Updated Vital Signs BP 139/73 (BP Location: Right Arm)   Pulse (!) 110   Temp 98.1 F (36.7 C)   Resp 18   LMP 07/08/2013   SpO2 97%  Physical Exam Vitals and nursing note reviewed.  Constitutional:      General: She is not in acute distress.    Appearance: She is well-developed.  HENT:  Head: Normocephalic and atraumatic.  Eyes:     Conjunctiva/sclera: Conjunctivae normal.  Cardiovascular:     Rate and Rhythm: Normal rate and regular rhythm.     Heart sounds: No murmur heard. Pulmonary:     Effort: Pulmonary effort is normal. No respiratory distress.     Breath sounds: Normal breath sounds.  Abdominal:     Palpations: Abdomen is soft.     Tenderness: There is no abdominal tenderness.  Musculoskeletal:        General: Signs of injury present. No swelling.     Cervical back: Neck supple.     Comments: With the distal aspect of the right little toe there is kind of a little bit of an avulsion of the skin  kind and lifting the toenail up some.  No obvious deformity good cap refill sensation intact.  No tenderness to the foot ankle or leg.  Skin:    General: Skin is warm and dry.     Capillary Refill: Capillary refill takes less than 2 seconds.  Neurological:     General: No focal deficit present.     Mental Status: She is alert and oriented to person, place, and time.  Psychiatric:        Mood and Affect: Mood normal.     ED Results / Procedures / Treatments   Labs (all labs ordered are listed, but only abnormal results are displayed) Labs Reviewed - No data to display  EKG None  Radiology No results found.  Procedures Procedures    Medications Ordered in ED Medications  Tdap (BOOSTRIX) injection 0.5 mL (has no administration in time range)    ED Course/ Medical Decision Making/ A&P                                 Medical Decision Making Amount and/or Complexity of Data Reviewed Radiology: ordered.  Risk Prescription drug management.   Patient's tetanus will be updated.  And we will get x-ray of the right little toe just to rule out fracture.  Then wound care will be all that is needed.  There is nothing to suture repair.  Difficult to say whether the little toenail will survive or not.  There is evidence of a distal toe fracture.  Will buddy tape it with the fourth toe.  Will also have them soak her foot here.   Final Clinical Impression(s) / ED Diagnoses Final diagnoses:  Toe injury, right, initial encounter    Rx / DC Orders ED Discharge Orders     None         Vanetta Mulders, MD 12/17/23 2154    Vanetta Mulders, MD 12/17/23 2227

## 2023-12-17 NOTE — ED Triage Notes (Addendum)
 Right little toe. Hit on a brick.  Need tetanus

## 2023-12-17 NOTE — ED Notes (Signed)
 Pt discharged home after verbalizing understanding of discharge instructions; nad noted.

## 2024-08-19 ENCOUNTER — Emergency Department (HOSPITAL_BASED_OUTPATIENT_CLINIC_OR_DEPARTMENT_OTHER)
Admission: EM | Admit: 2024-08-19 | Discharge: 2024-08-19 | Disposition: A | Discharge status: Home / Self Care | Attending: Emergency Medicine | Admitting: Emergency Medicine

## 2024-08-19 ENCOUNTER — Other Ambulatory Visit: Payer: Self-pay

## 2024-08-19 ENCOUNTER — Emergency Department (HOSPITAL_BASED_OUTPATIENT_CLINIC_OR_DEPARTMENT_OTHER)

## 2024-08-19 ENCOUNTER — Encounter (HOSPITAL_BASED_OUTPATIENT_CLINIC_OR_DEPARTMENT_OTHER): Payer: Self-pay

## 2024-08-19 DIAGNOSIS — I10 Essential (primary) hypertension: Secondary | ICD-10-CM | POA: Diagnosis not present

## 2024-08-19 DIAGNOSIS — W19XXXA Unspecified fall, initial encounter: Secondary | ICD-10-CM

## 2024-08-19 DIAGNOSIS — W010XXA Fall on same level from slipping, tripping and stumbling without subsequent striking against object, initial encounter: Secondary | ICD-10-CM | POA: Diagnosis not present

## 2024-08-19 DIAGNOSIS — Z859 Personal history of malignant neoplasm, unspecified: Secondary | ICD-10-CM | POA: Diagnosis not present

## 2024-08-19 DIAGNOSIS — M25511 Pain in right shoulder: Secondary | ICD-10-CM | POA: Diagnosis present

## 2024-08-19 DIAGNOSIS — Z79899 Other long term (current) drug therapy: Secondary | ICD-10-CM | POA: Diagnosis not present

## 2024-08-19 MED ORDER — METHOCARBAMOL 500 MG PO TABS: 500.0000 mg | ORAL_TABLET | Freq: Two times a day (BID) | ORAL | 0 refills | Status: AC

## 2024-08-19 NOTE — ED Triage Notes (Signed)
 Patient reports falling while picking up Chinese food on a rolled up carpet. She landed on her right side and her right shoulder is difficult to raise. -LOC, and not on any anticoagulants.

## 2024-08-19 NOTE — ED Provider Notes (Signed)
 [place] EMERGENCY DEPARTMENT AT [facility] Provider Note   CSN: [id number] Arrival date & time: 08/19/24  1845     Patient presents with: Fall and Shoulder Injury   Tamara Ferguson is a 53 y.o. female with noncontributory past medical history presents with complaints of mechanical fall at ground-level.  Patient states that she tripped on a rolled up carpet and landed on her right shoulder.  Did not hit her head or lose consciousness.  She only complains of pain to her shoulder.  It is worse with movement.  No numbness or tingling.    Fall  Shoulder Injury   Past Medical History:  Diagnosis Date   Arthritis    Cancer (HCC)    Hypertension    Past Surgical History:  Procedure Laterality Date   ABDOMINAL HYSTERECTOMY     ANTERIOR CRUCIATE LIGAMENT REPAIR     CARDIAC ELECTROPHYSIOLOGY STUDY AND ABLATION     LOOP RECORDER IMPLANT  2022   MASTECTOMY     NASAL RECONSTRUCTION WITH SEPTAL REPAIR         Prior to Admission medications   Medication Sig Start Date End Date Taking? Authorizing Provider  methocarbamol  (ROBAXIN ) 500 MG tablet Take 1 tablet (500 mg total) by mouth 2 (two) times daily. 08/19/24  Yes Donnajean Lynwood DEL, PA-C  albuterol  (VENTOLIN  HFA) 108 (90 Base) MCG/ACT inhaler Inhale 1-2 puffs into the lungs every 6 (six) hours as needed for wheezing or shortness of breath. 07/03/21   Theotis Peers M, PA-C  anastrozole (ARIMIDEX) 1 MG tablet Take by mouth. 09/02/22   [provider]  atorvastatin (LIPITOR) 40 MG tablet Take 40 mg by mouth daily. 06/30/22   [provider]  benzonatate  (TESSALON ) 100 MG capsule Take 1 capsule (100 mg total) by mouth every 8 (eight) hours. 07/03/21   Theotis Peers HERO, PA-C  carboxymethylcellul-glycerin (LUBRICATING EYE DROPS) 0.5-0.9 % ophthalmic solution Place 1 drop into the right eye 4 (four) times daily as needed for dry eyes. 09/20/22   Kingsley, Victoria K, DO  EPINEPHrine  (EPIPEN  2-PAK) 0.3 mg/0.3 mL  IJ SOAJ injection Inject 0.3 mg into the muscle as needed for anaphylaxis. 09/22/22   Marinda Rocky SAILOR, MD  fluticasone  (FLONASE ) 50 MCG/ACT nasal spray Place 2 sprays into both nostrils daily. 09/26/22   Iva Marty Saltness, MD  guaiFENesin  (MUCINEX ) 600 MG 12 hr tablet Take 1 tablet (600 mg total) by mouth 2 (two) times daily. 09/20/22   Kingsley, Victoria K, DO  ibuprofen (ADVIL,MOTRIN) 200 MG tablet Take 400 mg by mouth every 6 (six) hours as needed for pain.    [provider]  ipratropium (ATROVENT ) 0.03 % nasal spray Place 2 sprays into both nostrils 3 (three) times daily. 09/26/22   Iva Marty Saltness, MD  OTEZLA 30 MG TABS Take 1 tablet by mouth 2 (two) times daily. 09/01/22   [provider]  pantoprazole (PROTONIX) 40 MG tablet Take 40 mg by mouth daily. 06/13/22   [provider]  propranolol (INDERAL) 20 MG tablet Take 20 mg by mouth 3 (three) times daily. 05/19/22   [provider]  triamcinolone  ointment (KENALOG ) 0.5 % Apply 1 Application topically 2 (two) times daily. 02/16/23   Iva Marty Saltness, MD  valACYclovir  (VALTREX ) 1000 MG tablet Take 1,000 mg by mouth 3 (three) times daily. 09/20/22   [provider]    Allergies: Sulfa antibiotics, Coal tar extract, Darunavir, Penicillins, Septra [sulfamethoxazole-trimethoprim], and Penicillins    Review of Systems  Musculoskeletal:  Positive for arthralgias.    Updated Vital Signs BP (!) 131/93 (BP Location: Left Arm)   Pulse 90   Temp 97.8 F (36.6 C)   Resp 16   LMP 07/08/2013   SpO2 98%   Physical Exam Vitals and nursing note reviewed.  Constitutional:      General: She is not in acute distress.    Appearance: She is well-developed.  HENT:     Head: Normocephalic and atraumatic.  Eyes:     Conjunctiva/sclera: Conjunctivae normal.  Cardiovascular:     Rate and Rhythm: Normal rate and regular rhythm.     Heart sounds: No murmur heard. Pulmonary:     Effort: Pulmonary effort  is normal. No respiratory distress.     Breath sounds: Normal breath sounds.  Abdominal:     Palpations: Abdomen is soft.     Tenderness: There is no abdominal tenderness.  Musculoskeletal:        General: No swelling.     Cervical back: Neck supple.     Comments: Generalized tenderness to right shoulder without increased deformities, no surrounding ecchymosis or swelling, radial pulse 2+, tolerates full range of motion with discomfort  Skin:    General: Skin is warm and dry.     Capillary Refill: Capillary refill takes less than 2 seconds.  Neurological:     Mental Status: She is alert.  Psychiatric:        Mood and Affect: Mood normal.     (all labs ordered are listed, but only abnormal results are displayed) Labs Reviewed - No data to display  EKG: None  Radiology: DG Shoulder Right Result Date: 08/19/2024 CLINICAL DATA:  Clemens, right shoulder pain EXAM: RIGHT SHOULDER - 2+ VIEW COMPARISON:  None Available. FINDINGS: Frontal and transscapular views of the right shoulder are obtained. No acute fracture, subluxation, or dislocation. Mild acromioclavicular joint osteoarthritis. Soft tissues are unremarkable. Right chest is clear. IMPRESSION: 1. Osteoarthritis of the right acromioclavicular joint. 2. No acute fracture. Electronically Signed   By: Ozell Daring M.D.   On: 08/19/2024 19:41     Procedures   Medications Ordered in the ED - No data to display  Clinical Course as of 08/19/24 2012  Mon Aug 19, 2024  8082 Patient evaluated for ground-level mechanical fall with primary complaints of right shoulder pain.  Upon arrival she is hemodynamically stable.  On exam she has generalized tenderness to the right shoulder without any gross deformities, she is neurovascularly intact.  Will obtain imaging for further evaluation.  Offered something for pain, she declines. [JT]  1945 DG Shoulder Right Osteoarthritis of AC joint without any acute abnormality [JT]  2010 Patient will be  discharged home.  Will provide Ortho follow-up.  She is additionally interested in something for her her discomfort at home to help her sleep.  Will send in prescription for Robaxin .  Sedating warnings provided. [JT]    Clinical Course User Index [JT] Donnajean Lynwood DEL, PA-C                                 Medical Decision Making  This patient presents to the ED with chief complaint(s) of fall .  The complaint involves an extensive differential diagnosis and also carries with it a high risk of complications and morbidity.   Pertinent past medical history as listed in HPI  The differential diagnosis includes  Fracture, sprain, dislocation Additional history obtained: Records  reviewed Care Everywhere/External Records  Disposition:   Patient will be discharged home. The patient has been appropriately medically screened and/or stabilized in the ED. I have low suspicion for any other emergent medical condition which would require further screening, evaluation or treatment in the ED or require inpatient management. At time of discharge the patient is hemodynamically stable and in no acute distress. I have discussed work-up results and diagnosis with patient and answered all questions. Patient is agreeable with discharge plan. We discussed strict return precautions for returning to the emergency department and they verbalized understanding.     Social Determinants of Health:   none  This note was dictated with voice recognition software.  Despite best efforts at proofreading, errors may have occurred which can change the documentation meaning.       Final diagnoses:  Fall, initial encounter    ED Discharge Orders          Ordered    methocarbamol  (ROBAXIN ) 500 MG tablet  2 times daily        08/19/24 2012               Donnajean Lynwood VEAR DEVONNA 08/19/24 2012    Freddi Hamilton, MD 08/19/24 2129

## 2024-08-19 NOTE — Discharge Instructions (Addendum)
 You were evaluated in the emergency room for a fall.  Your imaging did not show any significant abnormality.  A prescription for Robaxin , muscle relaxer was sent into her pharmacy.  Please avoid driving or drinking alcohol while using this medication as it may cause drowsiness.  Please follow with the orthopedic doctor if your symptoms persist.
